# Patient Record
Sex: Male | Born: 1935 | Race: White | Hispanic: No | Marital: Married | Smoking: Former smoker
Health system: Southern US, Community
[De-identification: ages and names within clinical notes are randomized; demographics above are authoritative.]

## PROBLEM LIST (undated history)

## (undated) DIAGNOSIS — E785 Hyperlipidemia, unspecified: Secondary | ICD-10-CM

## (undated) DIAGNOSIS — I714 Abdominal aortic aneurysm, without rupture, unspecified: Secondary | ICD-10-CM

## (undated) DIAGNOSIS — I5189 Other ill-defined heart diseases: Secondary | ICD-10-CM

## (undated) DIAGNOSIS — I10 Essential (primary) hypertension: Secondary | ICD-10-CM

## (undated) DIAGNOSIS — E213 Hyperparathyroidism, unspecified: Secondary | ICD-10-CM

## (undated) DIAGNOSIS — I129 Hypertensive chronic kidney disease with stage 1 through stage 4 chronic kidney disease, or unspecified chronic kidney disease: Secondary | ICD-10-CM

## (undated) DIAGNOSIS — E079 Disorder of thyroid, unspecified: Secondary | ICD-10-CM

## (undated) DIAGNOSIS — N184 Chronic kidney disease, stage 4 (severe): Secondary | ICD-10-CM

## (undated) HISTORY — DX: Hypertensive chronic kidney disease with stage 1 through stage 4 chronic kidney disease, or unspecified chronic kidney disease: I12.9

## (undated) HISTORY — DX: Other ill-defined heart diseases: I51.89

## (undated) HISTORY — DX: Abdominal aortic aneurysm, without rupture, unspecified: I71.40

## (undated) HISTORY — DX: Disorder of thyroid, unspecified: E07.9

## (undated) HISTORY — DX: Hyperparathyroidism, unspecified: E21.3

## (undated) HISTORY — DX: Abdominal aortic aneurysm, without rupture: I71.4

## (undated) HISTORY — DX: Chronic kidney disease, stage 4 (severe): N18.4

## (undated) HISTORY — DX: Hyperlipidemia, unspecified: E78.5

## (undated) HISTORY — DX: Essential (primary) hypertension: I10

---

## 2001-03-09 HISTORY — PX: CYSTECTOMY: SUR359

## 2001-05-04 ENCOUNTER — Encounter: Payer: Self-pay | Admitting: Emergency Medicine

## 2001-05-04 ENCOUNTER — Inpatient Hospital Stay (HOSPITAL_COMMUNITY): Admission: EM | Admit: 2001-05-04 | Discharge: 2001-05-12 | Payer: Self-pay | Admitting: Emergency Medicine

## 2001-05-05 ENCOUNTER — Encounter: Payer: Self-pay | Admitting: Family Medicine

## 2001-05-10 ENCOUNTER — Encounter: Payer: Self-pay | Admitting: Family Medicine

## 2001-05-17 ENCOUNTER — Encounter: Admission: RE | Admit: 2001-05-17 | Discharge: 2001-05-17 | Payer: Self-pay | Admitting: Family Medicine

## 2001-06-20 ENCOUNTER — Ambulatory Visit (HOSPITAL_COMMUNITY): Admission: RE | Admit: 2001-06-20 | Discharge: 2001-06-20 | Payer: Self-pay | Admitting: Gastroenterology

## 2001-06-20 ENCOUNTER — Encounter: Payer: Self-pay | Admitting: Gastroenterology

## 2001-06-23 ENCOUNTER — Ambulatory Visit (HOSPITAL_COMMUNITY): Admission: RE | Admit: 2001-06-23 | Discharge: 2001-06-23 | Payer: Self-pay | Admitting: Gastroenterology

## 2001-06-23 ENCOUNTER — Encounter: Payer: Self-pay | Admitting: Gastroenterology

## 2001-07-27 ENCOUNTER — Ambulatory Visit (HOSPITAL_COMMUNITY): Admission: RE | Admit: 2001-07-27 | Discharge: 2001-07-27 | Payer: Self-pay | Admitting: Gastroenterology

## 2001-07-27 ENCOUNTER — Encounter: Payer: Self-pay | Admitting: Gastroenterology

## 2001-08-30 ENCOUNTER — Encounter: Payer: Self-pay | Admitting: General Surgery

## 2001-08-30 ENCOUNTER — Ambulatory Visit (HOSPITAL_COMMUNITY): Admission: RE | Admit: 2001-08-30 | Discharge: 2001-08-30 | Payer: Self-pay | Admitting: General Surgery

## 2001-09-02 ENCOUNTER — Emergency Department (HOSPITAL_COMMUNITY): Admission: EM | Admit: 2001-09-02 | Discharge: 2001-09-02 | Payer: Self-pay | Admitting: *Deleted

## 2001-09-02 ENCOUNTER — Encounter: Payer: Self-pay | Admitting: *Deleted

## 2001-09-08 ENCOUNTER — Encounter: Payer: Self-pay | Admitting: General Surgery

## 2001-09-12 ENCOUNTER — Inpatient Hospital Stay (HOSPITAL_COMMUNITY): Admission: RE | Admit: 2001-09-12 | Discharge: 2001-09-18 | Payer: Self-pay | Admitting: General Surgery

## 2001-09-12 ENCOUNTER — Encounter (INDEPENDENT_AMBULATORY_CARE_PROVIDER_SITE_OTHER): Payer: Self-pay | Admitting: Specialist

## 2001-09-12 ENCOUNTER — Encounter: Payer: Self-pay | Admitting: General Surgery

## 2001-09-22 ENCOUNTER — Encounter: Payer: Self-pay | Admitting: Emergency Medicine

## 2001-09-22 ENCOUNTER — Inpatient Hospital Stay (HOSPITAL_COMMUNITY): Admission: EM | Admit: 2001-09-22 | Discharge: 2001-10-03 | Payer: Self-pay | Admitting: Emergency Medicine

## 2001-09-23 ENCOUNTER — Encounter: Payer: Self-pay | Admitting: General Surgery

## 2002-01-18 ENCOUNTER — Encounter: Payer: Self-pay | Admitting: General Surgery

## 2002-01-18 ENCOUNTER — Ambulatory Visit (HOSPITAL_COMMUNITY): Admission: RE | Admit: 2002-01-18 | Discharge: 2002-01-18 | Payer: Self-pay | Admitting: General Surgery

## 2004-07-22 ENCOUNTER — Encounter (INDEPENDENT_AMBULATORY_CARE_PROVIDER_SITE_OTHER): Payer: Self-pay | Admitting: Cardiology

## 2004-07-22 ENCOUNTER — Ambulatory Visit (HOSPITAL_COMMUNITY): Admission: RE | Admit: 2004-07-22 | Discharge: 2004-07-22 | Payer: Self-pay | Admitting: Family Medicine

## 2005-07-17 ENCOUNTER — Inpatient Hospital Stay (HOSPITAL_BASED_OUTPATIENT_CLINIC_OR_DEPARTMENT_OTHER): Admission: RE | Admit: 2005-07-17 | Discharge: 2005-07-17 | Payer: Self-pay | Admitting: Cardiovascular Disease

## 2006-07-20 ENCOUNTER — Encounter: Admission: RE | Admit: 2006-07-20 | Discharge: 2006-07-20 | Payer: Self-pay | Admitting: Gastroenterology

## 2006-11-15 ENCOUNTER — Ambulatory Visit: Payer: Self-pay | Admitting: Surgery

## 2007-01-17 ENCOUNTER — Ambulatory Visit: Payer: Self-pay | Admitting: Surgery

## 2007-01-17 ENCOUNTER — Encounter: Admission: RE | Admit: 2007-01-17 | Discharge: 2007-01-17 | Payer: Self-pay | Admitting: Surgery

## 2008-01-23 ENCOUNTER — Ambulatory Visit: Payer: Self-pay | Admitting: Surgery

## 2008-01-23 ENCOUNTER — Encounter: Admission: RE | Admit: 2008-01-23 | Discharge: 2008-01-23 | Payer: Self-pay | Admitting: Surgery

## 2008-10-18 ENCOUNTER — Other Ambulatory Visit: Payer: Self-pay | Admitting: Emergency Medicine

## 2008-10-18 ENCOUNTER — Inpatient Hospital Stay (HOSPITAL_COMMUNITY): Admission: EM | Admit: 2008-10-18 | Discharge: 2008-10-24 | Payer: Self-pay | Admitting: Nephrology

## 2008-10-19 ENCOUNTER — Ambulatory Visit: Payer: Self-pay | Admitting: Surgery

## 2008-10-28 ENCOUNTER — Inpatient Hospital Stay (HOSPITAL_COMMUNITY): Admission: EM | Admit: 2008-10-28 | Discharge: 2008-10-31 | Payer: Self-pay | Admitting: Emergency Medicine

## 2008-12-11 ENCOUNTER — Emergency Department (HOSPITAL_COMMUNITY): Admission: EM | Admit: 2008-12-11 | Discharge: 2008-12-11 | Payer: Self-pay | Admitting: Emergency Medicine

## 2008-12-18 ENCOUNTER — Encounter (HOSPITAL_COMMUNITY): Admission: RE | Admit: 2008-12-18 | Discharge: 2009-03-18 | Payer: Self-pay | Admitting: Nephrology

## 2009-01-28 ENCOUNTER — Ambulatory Visit: Payer: Self-pay | Admitting: Surgery

## 2009-04-02 ENCOUNTER — Encounter (HOSPITAL_COMMUNITY): Admission: RE | Admit: 2009-04-02 | Discharge: 2009-07-01 | Payer: Self-pay | Admitting: Nephrology

## 2009-07-08 ENCOUNTER — Ambulatory Visit: Payer: Self-pay | Admitting: Surgery

## 2009-07-09 ENCOUNTER — Encounter (HOSPITAL_COMMUNITY): Admission: RE | Admit: 2009-07-09 | Discharge: 2009-10-07 | Payer: Self-pay | Admitting: Nephrology

## 2009-10-15 ENCOUNTER — Encounter (HOSPITAL_COMMUNITY): Admission: RE | Admit: 2009-10-15 | Discharge: 2010-01-13 | Payer: Self-pay | Admitting: Nephrology

## 2009-11-21 IMAGING — CR DG CHEST 2V
2 series · 2 of 2 positions shown · non-contrast
Comparison: 09/23/2001

CLINICAL DATA: Weakness.

CHEST - 2 VIEW

[w chest pa]
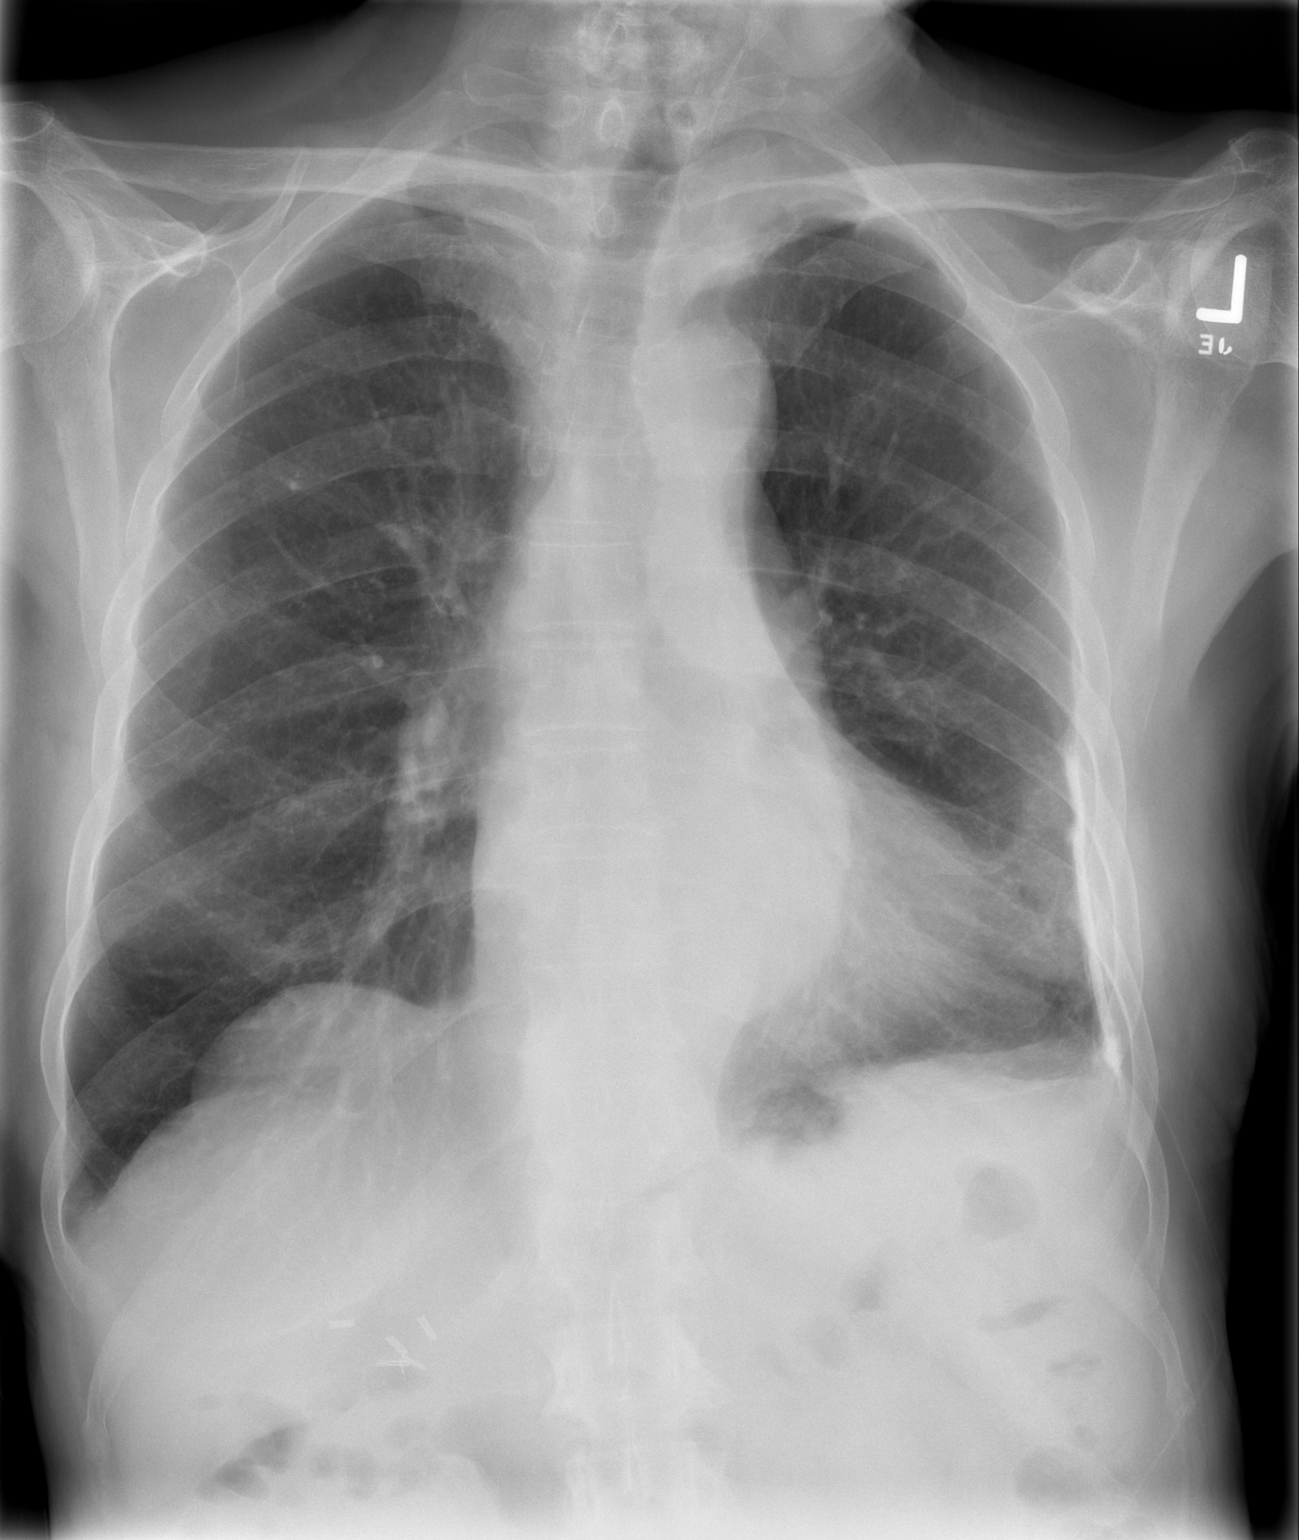

[w chest lat]
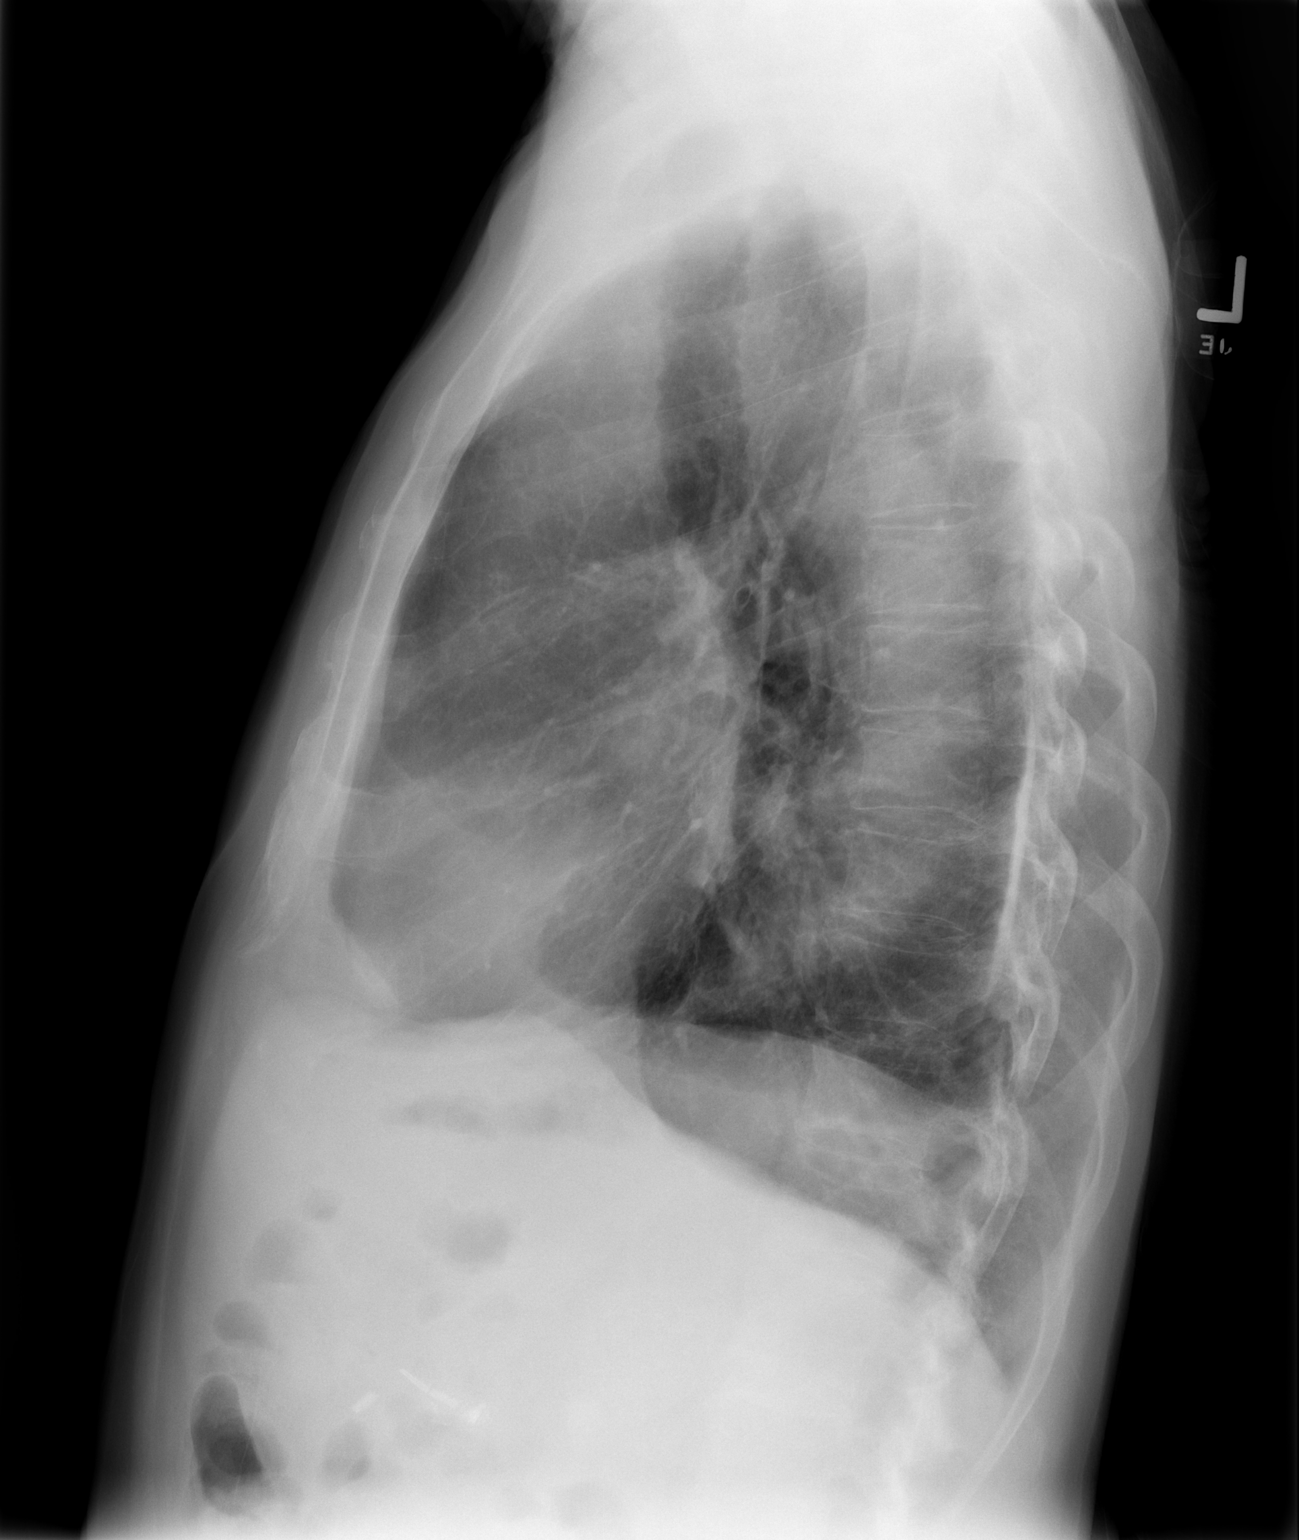

[2 of 2 positions shown; findings below may reference images not displayed]

FINDINGS: Heart size is mildly enlarged.

There is chronic pleural and parenchymal scarring at the left lung
base.  Calcified plaque is noted at the left base.

No airspace consolidation identified.

Review of the visualized osseous structures is unremarkable.
IMPRESSION: 1.  No acute findings.
2.  Chronic changes as above.

## 2010-01-21 ENCOUNTER — Encounter (HOSPITAL_COMMUNITY)
Admission: RE | Admit: 2010-01-21 | Discharge: 2010-04-08 | Payer: Self-pay | Source: Home / Self Care | Attending: Nephrology | Admitting: Nephrology

## 2010-03-24 ENCOUNTER — Ambulatory Visit
Admission: RE | Admit: 2010-03-24 | Discharge: 2010-03-24 | Payer: Self-pay | Source: Home / Self Care | Attending: Surgery | Admitting: Surgery

## 2010-04-07 NOTE — Procedures (Unsigned)
DUPLEX ULTRASOUND OF ABDOMINAL AORTA  INDICATION:  Followup AAA.  HISTORY: Diabetes:  No. Cardiac:  No. Hypertension:  Yes. Smoking:  No. Connective Tissue Disorder: Family History:  No. Previous Surgery:  No.  DUPLEX EXAM:         AP (cm)                   TRANSVERSE (cm) Proximal             Not visualized            Not visualized Mid                  4.06 cm                   3.80 cm Distal               4.54 cm                   4.73 cm Right Iliac          1.30 cm                   1.51 cm Left Iliac           1.35 cm                   1.35 cm  PREVIOUS:  Date:  07/08/2009  AP:  4.45  TRANSVERSE:  4.74  IMPRESSION:  Stable abdominal aortic dilatation that measures 4.54 x 4.73 cm.  ___________________________________________ V. Charlena Cross, MD  EM/MEDQ  D:  03/24/2010  T:  03/24/2010  Job:  [id number]

## 2010-05-20 LAB — FERRITIN: Ferritin: 264 ng/mL (ref 22–322)

## 2010-05-20 LAB — POCT HEMOGLOBIN-HEMACUE
Hemoglobin: 13.2 g/dL (ref 13.0–17.0)
Hemoglobin: 13.7 g/dL (ref 13.0–17.0)

## 2010-05-22 LAB — POCT HEMOGLOBIN-HEMACUE
Hemoglobin: 12.4 g/dL — ABNORMAL LOW (ref 13.0–17.0)
Hemoglobin: 14.3 g/dL (ref 13.0–17.0)
Hemoglobin: 13.5 g/dL (ref 13.0–17.0)

## 2010-05-22 LAB — FERRITIN
Ferritin: 230 ng/mL (ref 22–322)
Ferritin: 256 ng/mL (ref 22–322)

## 2010-05-22 LAB — IRON AND TIBC: Iron: 126 ug/dL (ref 42–135)

## 2010-05-23 LAB — POCT HEMOGLOBIN-HEMACUE: Hemoglobin: 13.5 g/dL (ref 13.0–17.0)

## 2010-05-23 LAB — IRON AND TIBC: Iron: 114 ug/dL (ref 42–135)

## 2010-05-24 LAB — IRON AND TIBC
Saturation Ratios: 31 % (ref 20–55)
TIBC: 307 ug/dL (ref 215–435)
UIBC: 213 ug/dL

## 2010-05-24 LAB — FERRITIN: Ferritin: 252 ng/mL (ref 22–322)

## 2010-05-24 LAB — POCT HEMOGLOBIN-HEMACUE: Hemoglobin: 12.5 g/dL — ABNORMAL LOW (ref 13.0–17.0)

## 2010-05-25 LAB — FERRITIN: Ferritin: 207 ng/mL (ref 22–322)

## 2010-05-25 LAB — POCT HEMOGLOBIN-HEMACUE
Hemoglobin: 15.1 g/dL (ref 13.0–17.0)
Hemoglobin: 13.5 g/dL (ref 13.0–17.0)
Hemoglobin: 13.5 g/dL (ref 13.0–17.0)
Hemoglobin: 13.1 g/dL (ref 13.0–17.0)

## 2010-05-25 LAB — IRON AND TIBC
Iron: 114 ug/dL (ref 42–135)
TIBC: 308 ug/dL (ref 215–435)

## 2010-05-26 LAB — IRON AND TIBC
Iron: 80 ug/dL (ref 42–135)
TIBC: 294 ug/dL (ref 215–435)
UIBC: 214 ug/dL

## 2010-05-26 LAB — POCT HEMOGLOBIN-HEMACUE: Hemoglobin: 12.5 g/dL — ABNORMAL LOW (ref 13.0–17.0)

## 2010-05-27 LAB — FERRITIN: Ferritin: 208 ng/mL (ref 22–322)

## 2010-05-27 LAB — IRON AND TIBC: TIBC: 310 ug/dL (ref 215–435)

## 2010-05-27 LAB — POCT HEMOGLOBIN-HEMACUE: Hemoglobin: 12.1 g/dL — ABNORMAL LOW (ref 13.0–17.0)

## 2010-05-28 LAB — IRON AND TIBC
Iron: 103 ug/dL (ref 42–135)
TIBC: 269 ug/dL (ref 215–435)
TIBC: 307 ug/dL (ref 215–435)
UIBC: 204 ug/dL

## 2010-05-28 LAB — POCT HEMOGLOBIN-HEMACUE: Hemoglobin: 11.8 g/dL — ABNORMAL LOW (ref 13.0–17.0)

## 2010-05-28 LAB — FERRITIN
Ferritin: 337 ng/mL — ABNORMAL HIGH (ref 22–322)
Ferritin: 266 ng/mL (ref 22–322)

## 2010-06-01 LAB — IRON AND TIBC: UIBC: 178 ug/dL

## 2010-06-01 LAB — FERRITIN: Ferritin: 312 ng/mL (ref 22–322)

## 2010-06-01 LAB — POCT HEMOGLOBIN-HEMACUE: Hemoglobin: 12.8 g/dL — ABNORMAL LOW (ref 13.0–17.0)

## 2010-06-09 LAB — POCT HEMOGLOBIN-HEMACUE: Hemoglobin: 12.6 g/dL — ABNORMAL LOW (ref 13.0–17.0)

## 2010-06-10 LAB — IRON AND TIBC
Iron: 62 ug/dL (ref 42–135)
UIBC: 248 ug/dL

## 2010-06-10 LAB — FERRITIN: Ferritin: 171 ng/mL (ref 22–322)

## 2010-06-11 LAB — IRON AND TIBC
Saturation Ratios: 16 % — ABNORMAL LOW (ref 20–55)
TIBC: 271 ug/dL (ref 215–435)

## 2010-06-11 LAB — POCT HEMOGLOBIN-HEMACUE
Hemoglobin: 11.6 g/dL — ABNORMAL LOW (ref 13.0–17.0)
Hemoglobin: 12.3 g/dL — ABNORMAL LOW (ref 13.0–17.0)
Hemoglobin: 10.6 g/dL — ABNORMAL LOW (ref 13.0–17.0)
Hemoglobin: 10.8 g/dL — ABNORMAL LOW (ref 13.0–17.0)

## 2010-06-12 LAB — COMPREHENSIVE METABOLIC PANEL
ALT: 9 U/L (ref 0–53)
AST: 18 U/L (ref 0–37)
Alkaline Phosphatase: 93 U/L (ref 39–117)
CO2: 26 mEq/L (ref 19–32)
Chloride: 109 mEq/L (ref 96–112)
GFR calc Af Amer: 18 mL/min — ABNORMAL LOW (ref >60)
GFR calc non Af Amer: 15 mL/min — ABNORMAL LOW (ref >60)
Glucose, Bld: 162 mg/dL — ABNORMAL HIGH (ref 70–99)
Sodium: 141 mEq/L (ref 135–145)
Total Bilirubin: 0.8 mg/dL (ref 0.3–1.2)

## 2010-06-12 LAB — DIFFERENTIAL
Basophils Absolute: 0 10*3/uL (ref 0.0–0.1)
Basophils Relative: 1 % (ref 0–1)
Eosinophils Absolute: 0.1 10*3/uL (ref 0.0–0.7)
Eosinophils Relative: 2 % (ref 0–5)
Lymphs Abs: 0.8 10*3/uL (ref 0.7–4.0)
Neutrophils Relative %: 77 % (ref 43–77)

## 2010-06-12 LAB — PROTIME-INR
INR: 1.16 (ref 0.00–1.49)
Prothrombin Time: 14.7 seconds (ref 11.6–15.2)

## 2010-06-12 LAB — CBC
Hemoglobin: 8 g/dL — ABNORMAL LOW (ref 13.0–17.0)
MCHC: 34 g/dL (ref 30.0–36.0)
RBC: 2.41 MIL/uL — ABNORMAL LOW (ref 4.22–5.81)
WBC: 6.6 10*3/uL (ref 4.0–10.5)

## 2010-06-12 LAB — TYPE AND SCREEN: Antibody Screen: NEGATIVE

## 2010-06-12 LAB — POCT HEMOGLOBIN-HEMACUE: Hemoglobin: 8.7 g/dL — ABNORMAL LOW (ref 13.0–17.0)

## 2010-06-14 LAB — URINALYSIS, MICROSCOPIC ONLY
Bilirubin Urine: NEGATIVE
Leukocytes, UA: NEGATIVE
Nitrite: NEGATIVE
Specific Gravity, Urine: 1.009 (ref 1.005–1.030)
Urobilinogen, UA: 0.2 mg/dL (ref 0.0–1.0)
pH: 7.5 (ref 5.0–8.0)

## 2010-06-14 LAB — URINALYSIS, ROUTINE W REFLEX MICROSCOPIC
Bilirubin Urine: NEGATIVE
Glucose, UA: 250 mg/dL — AB
Ketones, ur: 15 mg/dL — AB
Nitrite: NEGATIVE
Protein, ur: 100 mg/dL — AB
Specific Gravity, Urine: 1.013 (ref 1.005–1.030)
Urobilinogen, UA: 0.2 mg/dL (ref 0.0–1.0)
pH: 6.5 (ref 5.0–8.0)

## 2010-06-14 LAB — PROTEIN ELECTROPHORESIS, SERUM
Alpha-2-Globulin: 12.3 % — ABNORMAL HIGH (ref 7.1–11.8)
Beta Globulin: 5.8 % (ref 4.7–7.2)
Gamma Globulin: 15.2 % (ref 11.1–18.8)
M-Spike, %: NOT DETECTED g/dL

## 2010-06-14 LAB — RENAL FUNCTION PANEL
Albumin: 2.5 g/dL — ABNORMAL LOW (ref 3.5–5.2)
Albumin: 2.7 g/dL — ABNORMAL LOW (ref 3.5–5.2)
Albumin: 3 g/dL — ABNORMAL LOW (ref 3.5–5.2)
Albumin: 2.9 g/dL — ABNORMAL LOW (ref 3.5–5.2)
Albumin: 2.9 g/dL — ABNORMAL LOW (ref 3.5–5.2)
Albumin: 3.2 g/dL — ABNORMAL LOW (ref 3.5–5.2)
BUN: 60 mg/dL — ABNORMAL HIGH (ref 6–23)
BUN: 60 mg/dL — ABNORMAL HIGH (ref 6–23)
CO2: 18 mEq/L — ABNORMAL LOW (ref 19–32)
CO2: 23 mEq/L (ref 19–32)
CO2: 28 mEq/L (ref 19–32)
Calcium: 8.3 mg/dL — ABNORMAL LOW (ref 8.4–10.5)
Calcium: 8.3 mg/dL — ABNORMAL LOW (ref 8.4–10.5)
Calcium: 8.4 mg/dL (ref 8.4–10.5)
Calcium: 8.7 mg/dL (ref 8.4–10.5)
Calcium: 9 mg/dL (ref 8.4–10.5)
Calcium: 8.6 mg/dL (ref 8.4–10.5)
Chloride: 111 mEq/L (ref 96–112)
Chloride: 113 mEq/L — ABNORMAL HIGH (ref 96–112)
Chloride: 112 mEq/L (ref 96–112)
Chloride: 96 mEq/L (ref 96–112)
Creatinine, Ser: 10.98 mg/dL — ABNORMAL HIGH (ref 0.4–1.5)
Creatinine, Ser: 6.35 mg/dL — ABNORMAL HIGH (ref 0.4–1.5)
Creatinine, Ser: 6.45 mg/dL — ABNORMAL HIGH (ref 0.4–1.5)
Creatinine, Ser: 6.72 mg/dL — ABNORMAL HIGH (ref 0.4–1.5)
Creatinine, Ser: 8.18 mg/dL — ABNORMAL HIGH (ref 0.4–1.5)
Creatinine, Ser: 11.98 mg/dL — ABNORMAL HIGH (ref 0.4–1.5)
Creatinine, Ser: 13.5 mg/dL — ABNORMAL HIGH (ref 0.4–1.5)
Creatinine, Ser: 14.32 mg/dL — ABNORMAL HIGH (ref 0.4–1.5)
GFR calc Af Amer: 10 mL/min — ABNORMAL LOW (ref >60)
GFR calc Af Amer: 10 mL/min — ABNORMAL LOW (ref >60)
GFR calc Af Amer: 10 mL/min — ABNORMAL LOW (ref >60)
GFR calc Af Amer: 6 mL/min — ABNORMAL LOW (ref >60)
GFR calc Af Amer: 7 mL/min — ABNORMAL LOW (ref >60)
GFR calc Af Amer: 8 mL/min — ABNORMAL LOW (ref >60)
GFR calc Af Amer: 4 mL/min — ABNORMAL LOW (ref >60)
GFR calc Af Amer: 4 mL/min — ABNORMAL LOW (ref >60)
GFR calc Af Amer: 5 mL/min — ABNORMAL LOW (ref >60)
GFR calc non Af Amer: 5 mL/min — ABNORMAL LOW (ref >60)
GFR calc non Af Amer: 6 mL/min — ABNORMAL LOW (ref >60)
GFR calc non Af Amer: 6 mL/min — ABNORMAL LOW (ref >60)
GFR calc non Af Amer: 8 mL/min — ABNORMAL LOW (ref >60)
GFR calc non Af Amer: 9 mL/min — ABNORMAL LOW (ref >60)
GFR calc non Af Amer: 3 mL/min — ABNORMAL LOW (ref >60)
GFR calc non Af Amer: 4 mL/min — ABNORMAL LOW (ref >60)
GFR calc non Af Amer: 4 mL/min — ABNORMAL LOW (ref >60)
Glucose, Bld: 84 mg/dL (ref 70–99)
Glucose, Bld: 91 mg/dL (ref 70–99)
Glucose, Bld: 97 mg/dL (ref 70–99)
Phosphorus: 5 mg/dL — ABNORMAL HIGH (ref 2.3–4.6)
Phosphorus: 5.6 mg/dL — ABNORMAL HIGH (ref 2.3–4.6)
Phosphorus: 6.3 mg/dL — ABNORMAL HIGH (ref 2.3–4.6)
Phosphorus: 7.2 mg/dL — ABNORMAL HIGH (ref 2.3–4.6)
Phosphorus: 10.6 mg/dL — ABNORMAL HIGH (ref 2.3–4.6)
Potassium: 3.4 mEq/L — ABNORMAL LOW (ref 3.5–5.1)
Potassium: 4.5 mEq/L (ref 3.5–5.1)
Sodium: 139 mEq/L (ref 135–145)
Sodium: 141 mEq/L (ref 135–145)
Sodium: 142 mEq/L (ref 135–145)
Sodium: 142 mEq/L (ref 135–145)
Sodium: 143 mEq/L (ref 135–145)

## 2010-06-14 LAB — DIFFERENTIAL
Basophils Absolute: 0.1 10*3/uL (ref 0.0–0.1)
Basophils Relative: 1 % (ref 0–1)
Eosinophils Absolute: 0.5 10*3/uL (ref 0.0–0.7)
Eosinophils Relative: 7 % — ABNORMAL HIGH (ref 0–5)
Lymphocytes Relative: 15 % (ref 12–46)
Lymphs Abs: 1.1 10*3/uL (ref 0.7–4.0)
Monocytes Absolute: 0.7 K/uL (ref 0.1–1.0)
Monocytes Relative: 9 % (ref 3–12)
Neutro Abs: 5.3 K/uL (ref 1.7–7.7)
Neutrophils Relative %: 69 % (ref 43–77)

## 2010-06-14 LAB — TYPE AND SCREEN
ABO/RH(D): O POS
Antibody Screen: NEGATIVE

## 2010-06-14 LAB — PTH, INTACT AND CALCIUM: Calcium, Total (PTH): 8.5 mg/dL (ref 8.4–10.5)

## 2010-06-14 LAB — CBC
HCT: 25.4 % — ABNORMAL LOW (ref 39.0–52.0)
HCT: 25.7 % — ABNORMAL LOW (ref 39.0–52.0)
HCT: 26.2 % — ABNORMAL LOW (ref 39.0–52.0)
Hemoglobin: 8.7 g/dL — ABNORMAL LOW (ref 13.0–17.0)
Hemoglobin: 9.1 g/dL — ABNORMAL LOW (ref 13.0–17.0)
MCHC: 34.4 g/dL (ref 30.0–36.0)
MCHC: 34.4 g/dL (ref 30.0–36.0)
MCHC: 34.6 g/dL (ref 30.0–36.0)
MCHC: 34.7 g/dL (ref 30.0–36.0)
MCHC: 34.7 g/dL (ref 30.0–36.0)
MCHC: 34.9 g/dL (ref 30.0–36.0)
MCV: 92.3 fL (ref 78.0–100.0)
MCV: 92.4 fL (ref 78.0–100.0)
MCV: 92.4 fL (ref 78.0–100.0)
MCV: 95.4 fL (ref 78.0–100.0)
MCV: 95.4 fL (ref 78.0–100.0)
Platelets: 109 10*3/uL — ABNORMAL LOW (ref 150–400)
Platelets: 170 10*3/uL (ref 150–400)
Platelets: 90 10*3/uL — ABNORMAL LOW (ref 150–400)
Platelets: 113 10*3/uL — ABNORMAL LOW (ref 150–400)
Platelets: 115 10*3/uL — ABNORMAL LOW (ref 150–400)
Platelets: 117 10*3/uL — ABNORMAL LOW (ref 150–400)
RBC: 2.66 MIL/uL — ABNORMAL LOW (ref 4.22–5.81)
RBC: 2.83 MIL/uL — ABNORMAL LOW (ref 4.22–5.81)
RBC: 3.15 MIL/uL — ABNORMAL LOW (ref 4.22–5.81)
RDW: 13.7 % (ref 11.5–15.5)
RDW: 13.3 % (ref 11.5–15.5)
RDW: 13.5 % (ref 11.5–15.5)
WBC: 4 10*3/uL (ref 4.0–10.5)
WBC: 4.5 10*3/uL (ref 4.0–10.5)
WBC: 4.8 10*3/uL (ref 4.0–10.5)
WBC: 7.7 10*3/uL (ref 4.0–10.5)
WBC: 4.3 10*3/uL (ref 4.0–10.5)

## 2010-06-14 LAB — COMPREHENSIVE METABOLIC PANEL
AST: 11 U/L (ref 0–37)
Albumin: 2.9 g/dL — ABNORMAL LOW (ref 3.5–5.2)
Calcium: 8.3 mg/dL — ABNORMAL LOW (ref 8.4–10.5)
Creatinine, Ser: 14.74 mg/dL — ABNORMAL HIGH (ref 0.4–1.5)
GFR calc Af Amer: 4 mL/min — ABNORMAL LOW (ref >60)
GFR calc non Af Amer: 3 mL/min — ABNORMAL LOW (ref >60)

## 2010-06-14 LAB — BASIC METABOLIC PANEL
BUN: 55 mg/dL — ABNORMAL HIGH (ref 6–23)
CO2: 18 mEq/L — ABNORMAL LOW (ref 19–32)
CO2: 23 mEq/L (ref 19–32)
Calcium: 8.6 mg/dL (ref 8.4–10.5)
Calcium: 8.7 mg/dL (ref 8.4–10.5)
Chloride: 107 mEq/L (ref 96–112)
Creatinine, Ser: 10.13 mg/dL — ABNORMAL HIGH (ref 0.4–1.5)
Creatinine, Ser: 6.75 mg/dL — ABNORMAL HIGH (ref 0.4–1.5)
Creatinine, Ser: 9.03 mg/dL — ABNORMAL HIGH (ref 0.4–1.5)
GFR calc Af Amer: 10 mL/min — ABNORMAL LOW (ref >60)
GFR calc Af Amer: 6 mL/min — ABNORMAL LOW (ref >60)
GFR calc Af Amer: 7 mL/min — ABNORMAL LOW (ref >60)
GFR calc non Af Amer: 6 mL/min — ABNORMAL LOW (ref >60)
GFR calc non Af Amer: 8 mL/min — ABNORMAL LOW (ref >60)
Potassium: 3.6 mEq/L (ref 3.5–5.1)
Sodium: 140 mEq/L (ref 135–145)

## 2010-06-14 LAB — ABO/RH: ABO/RH(D): O POS

## 2010-06-14 LAB — PROTIME-INR
INR: 1.3 (ref 0.00–1.49)
INR: 1.2 (ref 0.00–1.49)
Prothrombin Time: 15.6 seconds — ABNORMAL HIGH (ref 11.6–15.2)

## 2010-06-14 LAB — HEMOGLOBIN AND HEMATOCRIT, BLOOD
HCT: 26 % — ABNORMAL LOW (ref 39.0–52.0)
Hemoglobin: 7.4 g/dL — CL (ref 13.0–17.0)
Hemoglobin: 9 g/dL — ABNORMAL LOW (ref 13.0–17.0)
Hemoglobin: 9.3 g/dL — ABNORMAL LOW (ref 13.0–17.0)

## 2010-06-14 LAB — POCT I-STAT, CHEM 8
BUN: 62 mg/dL — ABNORMAL HIGH (ref 6–23)
Creatinine, Ser: 6.6 mg/dL — ABNORMAL HIGH (ref 0.4–1.5)
Hemoglobin: 8.8 g/dL — ABNORMAL LOW (ref 13.0–17.0)
Potassium: 3.8 mEq/L (ref 3.5–5.1)
Sodium: 139 mEq/L (ref 135–145)
TCO2: 19 mmol/L (ref 0–100)

## 2010-06-14 LAB — UIFE/LIGHT CHAINS/TP QN, 24-HR UR
Albumin, U: DETECTED
Alpha 1, Urine: DETECTED — AB
Beta, Urine: DETECTED — AB
Free Lambda Excretion/Day: 108.97 mg/d
Gamma Globulin, Urine: DETECTED — AB
Total Protein, Urine: 25 mg/dL

## 2010-06-14 LAB — CK: Total CK: 38 U/L (ref 7–232)

## 2010-06-14 LAB — CLOSTRIDIUM DIFFICILE EIA

## 2010-06-14 LAB — URINE MICROSCOPIC-ADD ON

## 2010-06-14 LAB — HEMOCCULT GUIAC POC 1CARD (OFFICE): Fecal Occult Bld: NEGATIVE

## 2010-06-14 LAB — IRON AND TIBC

## 2010-06-14 LAB — APTT: aPTT: 30 s (ref 24–37)

## 2010-06-14 LAB — C3 COMPLEMENT: C3 Complement: 102 mg/dL (ref 88–201)

## 2010-06-15 LAB — COMPREHENSIVE METABOLIC PANEL
ALT: 11 U/L (ref 0–53)
Alkaline Phosphatase: 62 U/L (ref 39–117)
BUN: 107 mg/dL — ABNORMAL HIGH (ref 6–23)
CO2: 13 mEq/L — ABNORMAL LOW (ref 19–32)
Calcium: 8.7 mg/dL (ref 8.4–10.5)
GFR calc non Af Amer: 3 mL/min — ABNORMAL LOW (ref >60)
Glucose, Bld: 99 mg/dL (ref 70–99)
Potassium: 4.2 mEq/L (ref 3.5–5.1)
Sodium: 137 mEq/L (ref 135–145)

## 2010-06-15 LAB — URINALYSIS, ROUTINE W REFLEX MICROSCOPIC
Glucose, UA: 250 mg/dL — AB
Protein, ur: 100 mg/dL — AB
Specific Gravity, Urine: 1.012 (ref 1.005–1.030)
pH: 6 (ref 5.0–8.0)

## 2010-06-15 LAB — URINE CULTURE: Culture: NO GROWTH

## 2010-06-15 LAB — DIFFERENTIAL
Basophils Absolute: 0 10*3/uL (ref 0.0–0.1)
Basophils Relative: 0 % (ref 0–1)
Eosinophils Absolute: 0.4 10*3/uL (ref 0.0–0.7)
Neutro Abs: 2.9 10*3/uL (ref 1.7–7.7)
Neutrophils Relative %: 60 % (ref 43–77)

## 2010-06-15 LAB — URINE MICROSCOPIC-ADD ON

## 2010-06-15 LAB — CBC
HCT: 33.5 % — ABNORMAL LOW (ref 39.0–52.0)
Hemoglobin: 11.5 g/dL — ABNORMAL LOW (ref 13.0–17.0)
MCHC: 34.3 g/dL (ref 30.0–36.0)
RBC: 3.5 MIL/uL — ABNORMAL LOW (ref 4.22–5.81)
RDW: 13.7 % (ref 11.5–15.5)

## 2010-06-15 LAB — HEMOCCULT GUIAC POC 1CARD (OFFICE): Fecal Occult Bld: NEGATIVE

## 2010-07-22 NOTE — Consult Note (Signed)
NAME:  [name] [name], [name] [name]                ACCOUNT NO.:  [id number]   MEDICAL RECORD NO.:  [id number]          PATIENT TYPE:  INP   LOCATION:  6708                         FACILITY:  [facility]   PHYSICIAN:  Bernette Redbird, M.D.   DATE OF BIRTH:  [birth year]   DATE OF CONSULTATION:  DATE OF DISCHARGE:                                 CONSULTATION   Dr. Marthann Schiller of the [facility] asked [place] to see this 77-  year-old Caucasian male because of hematemesis.   [name] [name] was admitted to the hospital earlier today because of several  episodes of coffee-ground emesis yesterday evening, admixed with a small  amount of red blood, following perhaps 1 week prodrome of dyspeptic  symptoms after being treated with antibiotic therapy for a UTI.  He does  not have a past history of ulcer disease, but there is a remote history  of alcohol and cigarette abuse with pancreatic pseudocyst necessitating  cystogastrostomy drainage about 7 years ago, complicated by erosion and  severe hemorrhage requiring reoperation a few days later.  He is also  known to have an abdominal aortic aneurysm, although discussion with the  radiologist indicates that there appeared to be pretty good fat planes  in the region of the duodenal aortic apposition, rendering a low  likelihood that his recent bleeding reflected an aortoenteric fistula.  In fact, the main part of the aneurysm is substantially inferior to the  duodenal region.   The patient has had progressive chronic renal insufficiency with a  current creatinine clearance estimated to be on the order of 7 mm per  minute, and a creatinine of around 6 or 7.  This is marked change from  June of this year when his creatinine was under 2.  He is not yet on  dialysis.  He denies significant NSAID exposure, taking perhaps just a  couple of Advils per month.  He is not on a daily aspirin.  He does  admit to a poor appetite in recent months, but it does not sound so  there  has been any significant weight loss.  No real change in bowel  habits.  No syncopal symptoms or presyncope at the time of his recent  bleed.   PAST MEDICAL HISTORY:  No known allergies.   OUTPATIENT MEDICATIONS:  Amlodipine, calcitriol, gemfibrozil, PhosLo,  sertraline.   OPERATIONS:  Cholecystectomy and pancreatic cystogastrostomy.   MEDICAL ILLNESSES:  History of previous ethanol abuse with pancreatic  pseudocyst and subsequent drainage, abdominal aortic aneurysm with  maximum diameter less than 5 cm on most recent imaging study,  hypertension, dyslipidemia.   HABITS:  Stopped smoking and drinking years ago.   FAMILY HISTORY:  Negative for GI illnesses.   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   REVIEW OF SYSTEMS:  Pertinent for anorexia but no significant weight  loss, recent heartburn symptoms only after being treated with  antibiotics, no chronic diarrhea, constipation, melenic stools, or  rectal bleeding.   PHYSICAL EXAMINATION:  GENERAL:  A pleasant, reasonably well-preserved  and healthy-appearing Caucasian male, in no acute distress.  VITAL SIGNS:  Blood pressure 107/58, pulse 59, afebrile.  HEENT:  Anicteric.  No frank pallor, now status post transfusion of 1  unit of packed cells.  CHEST:  Clear to auscultation.  HEART:  Normal.  No gallops or arrhythmias.  ABDOMEN:  Benign with normal bowel sounds.  No organomegaly.  No  guarding, mass, or tenderness.  Fecal occult blood was negative on  admission.  RECTAL:  Not performed.   LABORATORY DATA:  The patient presented to the hospital with a  hemoglobin of 8.8, MCV 95, white count 7700, platelets 170,000.  On  recheck this morning, hemoglobin had fallen to 7.4.  He has now received  the first of 2 units of packed cells to be transfused.  Chemistry panel  pertinent for normal INR of 1.3.  Renal panel  showing BUN 60, creatinine  6.75, bicarb 18.  TSH normal.  Urinalysis at this time showing just a  few white cells, some blood, and protein   IMPRESSION:  1. Hematemesis, etiology unclear.  2. Posthemorrhagic anemia secondary to hematemesis.  3. Past history of alcohol abuse, pancreatic pseudocyst, and bleeding      from erosion of pancreatic pseudocyst into pancreatic      cystogastrostomy 7 years ago.  4. Chronic renal insufficiency, now near end stage.   RECOMMENDATIONS:  I would favor endoscopic evaluation at this time.  The  patient is agreeable.  Further management will depend on the endoscopic  findings.  In addition, it is noted that the patient is up-to-date on  screening colonoscopy, have one by me as an outpatient within the past  several years.           ______________________________  Bernette Redbird, M.D.     RB/MEDQ  D:  10/28/2008  T:  10/29/2008  Job:  [id number]   cc:   Garnetta Buddy, M.D.

## 2010-07-22 NOTE — Discharge Summary (Signed)
NAME:  [name] [name], [name] [name]                ACCOUNT NO.:  [id number]   MEDICAL RECORD NO.:  [id number]          PATIENT TYPE:  INP   LOCATION:  6708                         FACILITY:  [facility]   PHYSICIAN:  Eduard Clos, MDDATE OF BIRTH:  Jul 15, 1929   DATE OF ADMISSION:  10/28/2008  DATE OF DISCHARGE:  10/31/2008                               DISCHARGE SUMMARY   COURSE IN THE HOSPITAL:  A 77 year old male with known history of  chronic kidney disease, hypertension, presented with vomiting blood.  The patient was admitted to medical floor and serial CBCs were done.  Gastroenterology consult with Dr. Matthias Hughs was obtained.  The patient's  hemoglobin on admission was 8.7 and which did decrease to 7 and the  patient did receive 2 units of packed red blood cells.  The patient  underwent endoscopy, which showed a gastric ulcer which was not bleeding  and as per Dr. Matthias Hughs, no further intervention at this time and we will  do a repeat PT in 2 month's time for which Dr. Matthias Hughs will be calling  the patient.   During this stay, the patient also had creatinine elevated which is  known to the patient.  Nephrology was following the patient.  At this  time, sodium bicarbonate has been added.  The patient's blood pressure  was slightly in the lower range.  The patient is to take amlodipine,  which has been discontinued.  At this time, the patient's hemoglobin has  been largely stable.  The patient is asymptomatic and his last  hemoglobin was 8.9 with hematocrit of 25.8.  The patient will be  discharged home with close followup with primary care physician within a  weeks time in Dr. Hyman Hopes, the patient has appointment tomorrow.  The  patient was advised to strongly keep the appointment with Dr.  Hyman Hopes  tomorrow.  At the time of this dictation, the patient is hemodynamically  stable.   PROCEDURES DONE DURING THIS STAY:  Endoscopy, esophagogastroduodenoscopy  on October 28, 2008, which showed gastric ulcer,  not bleeding.   CONSULTATIONS OBTAINED:  1. Gastroenterology, Dr. Matthias Hughs.  2. Nephrology, Dr. Hyman Hopes.   PERTINENT LABS:  At time of discharge, the patient's hemoglobin was 8.9,  hematocrit 25.8, creatinine was 6.3 with a bicarb of 18.   FINAL DIAGNOSES:  1. Hematemesis with gastrointestinal bleed secondary to gastric ulcer,      presently stable hemoglobin, hematocrit.  2. History of hypertension, off medications now.  3. Chronic kidney disease.  4. Hyperlipidemia.  5. History of depression with no suicidal ideations.   MEDICATIONS AT DISCHARGE:  1. Calcitriol 0.25 mcg p.o. daily.  2. Gemfibrozil 600 mg twice daily.  3. PhosLo 667 mg three times daily with meals.  4. Zoloft 50 mg p.o. b.i.d.  5. Protonix 40 mg p.o. daily.  6. Sodium bicarbonate 650 mg two tablets p.o. b.i.d.   PLAN:  1. The patient advised to follow up with his primary care physician      within a week's time, recheck CBC and BMET.  Strongly advised to      keep the appointment  with Dr. Hyman Hopes, nephrologist tomorrow, November 01, 2008.  2. Dr. Donavan Burnet office will be calling the patient for followup EGD      in 2 months' time.  I have already discussed with Dr. Matthias Hughs.  3. The patient is to be on a cardiac healthy renal diet.  4. Activity as tolerated.  5. If any recurrence of symptoms, the patient is to go to the nearest      ER.      Eduard Clos, MD  Electronically Signed     ANK/MEDQ  D:  10/31/2008  T:  10/31/2008  Job:  [id number]   cc:   Garnetta Buddy, M.D.  Bernette Redbird, M.D.  [facility]

## 2010-07-22 NOTE — Op Note (Signed)
NAME:  [name] [name], [name] [name]                ACCOUNT NO.:  [id number]   MEDICAL RECORD NO.:  [id number]          PATIENT TYPE:  INP   LOCATION:  6708                         FACILITY:  [facility]   PHYSICIAN:  Bernette Redbird, M.D.   DATE OF BIRTH:  11-24-1929   DATE OF PROCEDURE:  10/28/2008  DATE OF DISCHARGE:                               OPERATIVE REPORT   SURGEON:  Bernette Redbird, MD   PROCEDURE:  Upper endoscopy.   INDICATIONS:  A 77 year old gentleman with hematemesis yesterday,  characterized by about 5 episodes of coffee-ground emesis admixed with  some fresh red blood.   FINDINGS:  Superficial proximal gastric ulcer with stigma of recent  hemorrhage, but no active bleeding.   PROCEDURE:  The patient was familiar with the procedure from prior  examination and he provided written consent, being brought from his  hospital room to the Endoscopy Unit in a fasted state.  Sedation was  Versed 3 mg IV prior to and during the course of the procedure to a  level of mild-to-moderate sedation without clinical instability.  The  Pentax adult video endoscope was passed under direct vision.  The vocal  cords looked normal.  The esophagus was entered without difficulty and  was normal in its entirety except for a widely patent Schatzki ring at  the squamocolumnar junction, below which was a minimal hiatal hernia.  No esophageal varices, infection, neoplasia, or Mallory-Weiss tear were  appreciated, and there was no evidence of reflux esophagitis, Barrett  esophagus, or free reflux.   The stomach was entered.  It contained a small clear residual, no blood,  coffee grounds, clot, or bile.  On the greater curve just below the GE  junction on a rugal fold, was a roughly 1 x 1.5 cm superficial ulcer  with a small stigma of hemorrhage within it and a slightly dirty base.  There was no protuberant visible vessel, no adherent clot, and certainly  no active bleeding.  Nearby on rugal folds were a couple of  erythematous, edematous but not really eroded areas.   In the distal stomach and what appeared to be the anterior wall, there  was some dimpling of the mucosa suggestive of the site of his previous  cyst gastrostomy.  I did not see any polyps, masses, diffuse gastritis,  or other lesions in the antrum of the stomach.  In retroflexed view, the  cardia was negative for varices or other abnormalities.  The pylorus,  duodenal bulb, and second duodenum looked normal.   Since there was no protuberant visible vessel, I elected not to perform  intervention on the ulcer, especially since it was about 20 hours since  he had last bled.  The scope was, therefore, removed from the patient  who tolerated the procedure well and there were no apparent  complications.   IMPRESSION:  Gastric ulcer, currently nonbleeding, but almost certainly  accounting for the patient's recent hematemesis.  The etiology of this  ulcer is unclear since the patient has not had recent ulcerogenic  medications, did not have diffuse gastritis, etc.   PLAN:  Continue  anti-peptic therapy.  The patient would seem to be at a  fairly low risk of re-bleeding in the next 24 hours, probably under 20%,  since he is now almost 24 hours out from his initial bleed.  I will  consider a repeat endoscopy in 2 months to confirm healing, although it  is controversial whether or not such exams are necessary for benign-  appearing lesions like this.           ______________________________  Bernette Redbird, M.D.     RB/MEDQ  D:  10/28/2008  T:  10/29/2008  Job:  [id number]   cc:   Garnetta Buddy, M.D.  [facility]

## 2010-07-22 NOTE — Procedures (Signed)
DUPLEX ULTRASOUND OF ABDOMINAL AORTA   INDICATION:  Followup of abdominal aortic aneurysm.   HISTORY:  Diabetes:  No  Cardiac:  No  Hypertension:  Yes  Smoking:  No  Family History:  No  Previous Surgery:   DUPLEX EXAM:         AP (cm)                   TRANSVERSE (cm)  Proximal             3.4 cm                    3.5 cm  Mid                  4.06 cm                   4.5 cm  Distal               4.5 cm                    5.2 cm  Right Iliac          1.7 cm                    1.4 cm  Left Iliac           1.3 cm                    1.2 cm   PREVIOUS:  Date: (CT)  AP:  4.2  TRANSVERSE:  4.3   IMPRESSION:  Abdominal aortic aneurysm with largest measurement of 4.5 x  5.2 cm.   ___________________________________________  V. Charlena Cross, MD   CJ/MEDQ  D:  01/28/2009  T:  01/28/2009  Job:  [id number]

## 2010-07-22 NOTE — Assessment & Plan Note (Signed)
OFFICE VISIT   [name], [name]  DOB:  Dec 12, 1929                                       01/17/2007  UXLKG#:[id number]   REASON FOR VISIT:  Follow up aneurysm   HISTORY:  [name] [name] is a 77 year old gentleman who returns today for  further evaluation of his abdominal aortic aneurysm.  I initially saw  [name] [name] in August.  At that time he was referred for evaluation of a 4-  cm aneurysm found incidentally on a CT scan in May.  I had elected to  repeat his CT scan in 6 months from his original date.  That was done  today and he comes back in for followup.  Regarding his aneurysm, he has  not had any abdominal pain.  He does not smoke and continues to manage  his hypertension and hypercholesterolemia with his current medical  regimen.  I was also concerned about lower extremity disease and  cerebrovascular disease.  For that reason, the patient comes back in  today for duplex evaluation.  He has not had any TIAs.  He has not had  any significant rest pain.   PHYSICAL EXAMINATION:  Pulse is 67, respirations 18, blood pressure is  136/92.  GENERAL:  He is well-appearing; no acute distress.  HEENT:  Normocephalic, atraumatic.  Sclerae are anicteric.  CARDIOVASCULAR:  Regular rate and rhythm without murmur.  RESPIRATIONS:  Nonlabored.  ABDOMEN:  Soft, nontender.  There is a midline scar without evidence of  a hernia.  There is no pulsatile mass.  EXTREMITIES:  Warm and well-perfused.  PSYCH:  He is alert and oriented x3.  NEUROLOGIC:  Cranial nerves II-XII are grossly intact.   DIAGNOSTIC STUDIES:  CT scan:  The patient had a noncontrast scan today,  given his renal insufficiency.  This showed that his infrarenal aortic  aneurysm has not grown in size and measures 4 cm in maximal diameter.   Carotid duplex was performed today which reveals 1% to 39% stenosis in  bilateral internal carotid arteries.   Lower extremity duplex was performed today, which revealed  ankle  brachial indices of 1 bilaterally.   ASSESSMENT:  Infrarenal abdominal aortic aneurysm and chronic renal  insufficiency.   PLAN:  Within the 5-month interval of his 2 scans, the patient's  aneurysm has not changed in size.  For that reason, I feel it is safe to  follow him up in 1 year with a repeat CT scan.  Again, this will be  noncontrast given his renal insufficiency.   Jorge Ny, MD  Electronically Signed   VWB/MEDQ  D:  01/17/2007  T:  01/18/2007  Job:  [id number]

## 2010-07-22 NOTE — Consult Note (Signed)
NAME:  Semidey, Gurtaj                ACCOUNT NO.:  [id number]   MEDICAL RECORD NO.:  [id number]          PATIENT TYPE:  INP   LOCATION:  6727                         FACILITY:  [facility]   PHYSICIAN:  Juleen China IV, MDDATE OF BIRTH:  08/13/1929   DATE OF CONSULTATION:  10/19/2008  DATE OF DISCHARGE:                                 CONSULTATION   CONSULTING PHYSICIAN:  Terrial Rhodes, MD   REASON FOR CONSULT:  Abdominal aortic aneurysm.   HISTORY:  Mr. Raden is a 77 year old gentleman that I have been following  for an infrarenal abdominal aortic aneurysm.  I most recently saw him in  November where his aneurysm measured 4.1 cm.  He was to be followed on a  yearly basis.  He presented to [facility] [place] on October 18, 2008, with a 4-month  history of urinary urgency.  He has been treated with antibiotics  recently for urinary tract infection.  He complains of not feeling well  as well as lower abdominal discomfort.  His symptoms of urinary urgency  improved with antibiotic therapy.  He was most recently found to have an  elevated creatinine, and therefore, was sent to the emergency department  for further evaluation.  His creatinine on arrival was 15.  Ultrasound  did not show any evidence of bladder obstruction or hydronephrosis.  Due  to his abdominal pain that it is not readily explained at this time, I  am asked to evaluate him for to see if his aneurysm is contributing to  his pain.  The patient states this pain has been present for  approximately 2 months.  There are no aggravating or relieving factors  that he reports.  He describes it as a right lower quadrant as well as a  periumbilical pain on the left.  It is not a boring pain into his back  but there is a dull tenderness.   Past medical history is positive for:  1. Hypertension.  2. Hyperlipidemia.  3. History of bleeding.  4. Pancreatic cyst, status post laparotomy.  5. Bilateral cataract extraction.  6. History of  cholecystectomy.  7. History of alcohol abuse.  8. Infrarenal abdominal aortic aneurysm.   SOCIAL HISTORY:  He is a former smoker and heavy drinker, none  currently.   Family history is negative for cardiovascular disease in early age.   Medications include hydrochlorothiazide, gemfibrozil, amlodipine, and  sertraline.   Allergies are none.   REVIEW OF SYSTEMS:  Negative except for as noted above in H and P.   PHYSICAL EXAMINATION:  VITAL SIGNS:  Temperature is 98.0, pulse 72,  respirations 16, blood pressure 128/63, O2 sats 97% on room air.  GENERAL:  He is well appearing, in no acute distress.  CARDIOVASCULAR:  Regular rate rhythm.  LUNGS:  Clear bilaterally.  NECK:  Supple without JVD.  ABDOMEN:  Soft.  He has a well-healed midline incision.  There is a mild  tenderness in the right lower quadrant.  He does not have pain with deep  palpation over the area of his aorta.  EXTREMITIES:  Warm and well perfused.  NEURO:  Cranial nerves II through XII are grossly intact.  He has no  focal deficits.  PSYCH:  He is alert and oriented x3.   LABORATORY VALUES:  Potassium is 4.5, creatinine is 14.3.  Hemoglobin is  10, platelet count is 115.   ASSESSMENT AND PLAN:  This is a 77 year old gentleman with a known  infrarenal abdominal aortic aneurysm with 53-month history of abdominal  pain as well as acute renal failure.   PLAN:  I have discussed this with Dr. Arrie Aran.  I feel the best next  step to further evaluate his aneurysm is to proceed with a noncontrasted  CT scan to see if he has any form of a contained leak versus significant  change in his aneurysm size.  Most likely, however, I do not feel that  his aneurysm is contributing to his current symptomatology; however, we  will follow him while he is in the  hospital and make recommendations  based on his CT scan.      Jorge Ny, MD  Electronically Signed     VWB/MEDQ  D:  10/20/2008  T:  10/20/2008  Job:   [id number]

## 2010-07-22 NOTE — Assessment & Plan Note (Signed)
OFFICE VISIT   [name], [name]  DOB:  [birth year]                                       11/15/2006  ZOXWR#:[id number]   REASON FOR REFERRAL:  Abdominal aortic aneurysm.   The patient is a 77 year old gentleman I am seeing in consultation at  the request of Dr. Hyman Hopes for evaluation of abdominal aortic aneurysm.  Patient was found to have a 4-cm abdominal aortic aneurysm on non-  contrast CT scan back in May, done for back pain.  He has subsequently  developed an elevation in his creatinine, for which Dr. Hyman Hopes is  managing.  This seems likely due to his ACE inhibitor.  At this time the  patient has no symptoms from his aneurysm.  Specifically, he has no  abdominal or back pain.   REVIEW OF SYSTEMS:  GENERAL:  His weight is 159 pounds.  CARDIAC:  He  complains of occasional chest pain and shortness of breath with  exertion.  PULMONARY:  He says he has asthma.  GASTROINTESTINAL:  Occasional abdominal pain.  GENITOURINARY:  No dysuria.  VASCULAR:  He  states that after walking long distances his legs tend to get weak.  This is exacerbated by walking uphill.  NEURO:  He denies amaurosis or  weakness/numbness.  ORTHO:  He complains of joint pain.  PSYCHIATRIC:  Nervousness.   PAST MEDICAL HISTORY:  Significant for hypertension,  hypercholesterolemia, chronic renal insufficiency and pancreatitis.   PAST SURGICAL HISTORY:  Cholecystectomy, a pancreas operation, and  bilateral cataracts.   FAMILY HISTORY:  Significant for vascular disease in mother and his  brother.   SOCIAL HISTORY:  He is married.  He does not currently smoke.  He does  have a history of smoking but quit in 1990.  He has a history of alcohol  abuse but not since 1984.   ALLERGIES:  None.   MEDICATIONS:  1. Hydrochlorothiazide 25 mg once a day.  2. Amlodipine 10 mg once a day.  3. Paroxetine 4 mg once a day.  4. Tricor 145 mg once a day.  5. Acid reducer 150 mg once a day.  6. Tandem Plus  once a day.   PHYSICAL EXAMINATION:  Vital signs:  Heart rate 78, blood pressure  152/78, respirations 18.  General:  He is well appearing, no obvious  distress.  HEENT:  Normocephalic and atraumatic.  No carotid bruits.  Cardiovascular:  Regular rate and rhythm.  Respirations:  Clear to  auscultation bilaterally.  Abdomen:  Soft, nontender.  There is a well  healed midline scar.  Extremities:  He has palpable femoral pulses  bilaterally.  His extremities are warm and well perfused.  Neurologic:  No cranial nerve deficits.   ASSESSMENT/PLAN:  This is a 77 year old gentleman with a 4-cm  asymptomatic abdominal aortic aneurysm, diagnosed incidentally by CT  scan.   Plan at this time, given the patient's renal insufficiency, I will start  with obtaining a non-contrast at 2-mm-cut CT scan.  We will do this 6  months following his preceding CT which was done in May, so he will have  this in November.  He will follow up to see me after this has been done.  At that time he will also undergo carotid duplex as well as lower  extremity arterial studies.   We discussed the size of the aneurysm today  as well as the indications  for repair as well as the symptoms of rupture.  The patient if he has  any difficulty has been instructed to call me.  Otherwise I will see him  in November.   Jorge Ny, MD  Electronically Signed   VWB/MEDQ  D:  11/15/2006  T:  11/16/2006  Job:  [id number]

## 2010-07-22 NOTE — H&P (Signed)
NAME:  [name] [name], [name] [name]                ACCOUNT NO.:  [id number]   MEDICAL RECORD NO.:  [id number]          PATIENT TYPE:  INP   LOCATION:  6708                         FACILITY:  [facility]   PHYSICIAN:  Della Goo, M.D. DATE OF BIRTH:  [birth year]   DATE OF ADMISSION:  10/28/2008  DATE OF DISCHARGE:                              HISTORY & PHYSICAL   PRIMARY CARE PHYSICIAN:  Teena Irani. Arlyce Dice, M.D.   CHIEF COMPLAINT:  Vomiting blood.   HISTORY OF PRESENT ILLNESS:  A 77 year old male who presented to the  emergency department secondary to complaints of sudden onset of  hematemesis.  He reports emesis that was bloody and red in color, and  ranged to coffee grounds and dark brown color.  The patient stated that  he was awakened by discomfort that felt like indigestion, and began to  vomit at about 11:00 p.m.  He also reports having a heartburn sensation.  He denies having any diarrhea or passing any bloody stool.  He denies  having any chest pain or shortness of breath, lightheadedness, syncope.   The patient was last hospitalized October 18, 2008 for acute renal  failure/ acute interstitial nephritis.  He was managed at that time by  the Renal Service.   When the patient was hospitalized recently, his hemoglobin level on  October 21, 2008 was 10.2.  On return to the Emergency Department this  evening the patient's hemoglobin was found to be 8.7.   PAST MEDICAL HISTORY:  As mentioned above, also history of:  1. Hypertension.  2. Hyperlipidemia.  3. Abdominal aortic aneurysm.  4. Previous bleeding pancreatic cyst, status post excision.  5. Status post exploratory laparotomy and surgical repair.  6. Status post cholecystectomy.  7. Status post bilateral cataract surgeries.  8. Chronic renal insufficiency.   MEDICATIONS AT THIS TIME:  1. Amlodipine 10 mg one p.o. daily.  2. Calcitriol 0.25 mg p.o. daily.  3. Gemfibrozil 600 mg p.o. b.i.d.  4. PhosLo 667 mg t.i.d. with meals.  5.  Sertraline 150 mg p.o. daily   ALLERGIES:  No known drug allergies.   SOCIAL HISTORY:  The patient has remote history of alcohol and tobacco  abuse.   FAMILY HISTORY:  Positive for congestive heart failure in his mother.  No history of diabetes, hypertension or cancer in his family that he  knows of.   REVIEW OF SYSTEMS:  Pertinents are mentioned above.   PHYSICAL EXAMINATION FINDINGS:  This is a 77 year old elderly male, in  no visible discomfort or acute distress currently.  VITAL SIGNS:  Temperature 97.2, blood pressure 104/64, heart rate 77, respirations 16,  O2 saturations 100%.  HEENT:  Normocephalic, atraumatic.  Pupils equally round and reactive to  light.  Extraocular movements are intact.  Funduscopic benign.  There is  no scleral icterus.  Nares are patent bilaterally.  Oropharynx is clear.  NECK:  Supple, full range of motion.  No thyromegaly, adenopathy or  jugular venous distention.  CARDIOVASCULAR:  Regular rate and rhythm.  Normal S1 and S2.  No  murmurs, gallops or rubs.  LUNGS:  Clear to  auscultation bilaterally.  ABDOMEN:  Positive bowel sounds; soft, nontender and nondistended.  EXTREMITIES:  Without cyanosis, clubbing or edema.  NEUROLOGIC:  The patient is alert and oriented x3.  Cranial nerves are  intact.  There are no focal deficits.   LABORATORY STUDIES:  White blood cell count 7.7, hemoglobin 8.7,  hematocrit 25.4, MCV 95.4, platelets 170, neutrophils 69%, lymphocytes  15%.  Sodium 139, potassium 3.8, chloride 112, CO2 19, BUN 62,  creatinine 6.6, glucose 125.  Prothrombin Time 15.6, INR 1.3, PTT 30.  Fecal occult blood testing negative.  Urinalysis:  With urine glucose  250, small urine hemoglobin, 100 total protein, and trace leukocyte  esterase.  Urine Microscopic:  3-6 white blood cells, 3-6 red blood  cells, rare bacteria.   ASSESSMENT:  A 77 year old male being admitted with:  1. Hematemesis/gastrointestinal bleeding.  2. Anemia.  3. Chronic  kidney disease/chronic renal insufficiency.   PLAN:  The patient will be admitted to the telemetry area for  monitoring.  The patient will be placed on IV fluids for fluid  resuscitation and maintenance therapy.  Clear liquids have been ordered  for now.  IV Protonix therapy has also been ordered.  Two units of  packed red blood cells have been placed on hold, in the event the  patient needs transfusion.  Serial hemoglobin and hematocrit levels will  also be performed to monitor for further blood loss.  A gastrointestinal  consultation will be requested.  SCDs will be ordered for deep vein  thrombosis prophylaxis.  The patient's regular medications will also be  continued.      Della Goo, M.D.  Electronically Signed     HJ/MEDQ  D:  10/28/2008  T:  10/28/2008  Job:  [id number]   cc:   Teena Irani. Arlyce Dice, M.D.

## 2010-07-22 NOTE — Consult Note (Signed)
NAME:  [name] [name], [name] [name]                ACCOUNT NO.:  [id number]   MEDICAL RECORD NO.:  [id number]          PATIENT TYPE:  INP   LOCATION:  6708                         FACILITY:  [facility]   PHYSICIAN:  Garnetta Buddy, M.D.   DATE OF BIRTH:  August 24, 1929   DATE OF CONSULTATION:  10/28/2008  DATE OF DISCHARGE:                                 CONSULTATION   REASON FOR CONSULTATION:  Chronic kidney disease.   HISTORY OF PRESENT ILLNESS:  This is a 77 year old white male with a  history of hospital discharge on October 24, 2008 for acute renal failure  secondary to AIN who presents with new-onset hematemesis x3 last night.  The patient was admitted per hospitalist team, and GI was consulted who  planned to take the patient to endoscopy this afternoon.  The patient  has had no further episodes of emesis since admission.  He feels well  with no chest pain, shortness of breath, lightheadedness, fever,  diarrhea or urinary retention.  He had some mild abdominal pain in the  lower abdomen with pulls himself up.  Currently, he is receiving 2 units  of packed red blood cells, as he had a hemoglobin of 8.7 on admission  which had trended downward to 7.4 this morning.   The patient has a history of mild chronic renal insufficiency with  baseline creatinine of 1.5 two months ago per previous discharge  summary.  The patient was admitted October 18, 2008 through October 24, 2008 with acute renal failure secondary to AIN in the setting of  decreased p.o. intake, Bactrim and Cipro use for a urinary tract  infection.  During this hospitalization, the patient had a creatinine as  high as 15.36 which trended down one point per day to 8.18 at discharge.  Workup for other causes of acute renal failure were negative.   PAST MEDICAL HISTORY:  1. Hypertension.  2. Hyperlipidemia.  3. 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  4. Left inguinal hernia.  5. History of bleeding pancreatic cyst.  6. History of  alcoholism.  7. Depression.  8. Secondary hyperparathyroid hormone.   PAST SURGICAL HISTORY:  1. Cholecystectomy.  2. Bilateral cataracts.  3. Status shows laparoscopy and surgical repair of pancreas cyst.   ALLERGIES:  No known drug allergies.   HOME MEDICATIONS:  1. PhosLo 665 mg one p.o. with meals t.i.d.  2. Calcitriol 0.25 mcg p.o. daily.  3. Lopid 600 mg p.o. b.i.d.  4. Norvasc 10 mg p.o. daily.  5. Zoloft 50 mg p.o. b.i.d.   CURRENT MEDICATIONS:  The same as above except for Norvasc is currently  held.  The patient is also on Zofran, Dilaudid, coated, Phenergan,  Fleet's enema   SOCIAL HISTORY:  The patient is a former smoker and drinker.  He lives  at home with his wife.   FAMILY HISTORY:  Noncontributory.   REVIEW OF SYSTEMS:  Negative except as per HPI.   PHYSICAL EXAMINATION:  VITAL SIGNS:  Temperature 97.9, heart rate 59-70,  respirations 14-18, blood pressure 108/58, 100% on room air.  GENERAL:  Alert and oriented  x4, pleasant, well-appearing.  CARDIOVASCULAR:  Regular rate and rhythm.  No murmurs.  RESPIRATORY:  Clear to auscultation bilaterally.  ABDOMEN:  Positive bowel sounds, soft, nondistended, nontender.  EXTREMITIES:  Left edema.   LABORATORY DATA:  1. TSH 2.61.  2. CBC - white blood count 7.9, hemoglobin 8.7 trending down to 7.4,      platelets 170, 69% neutrophils.  3. Fecal occult blood negative.  4. PT 1.3.  5. Urinalysis yellow, cloudy, specific gravity 1.013, 250 glucose, 15      ketones, small blood, 100 protein, negative nitrates, trace      leukocytes.  Microscopy showed granular casts, 0-3 white blood      cells, 3-6 red blood cells, rare bacteria.  6. BMET - sodium 140, potassium 3.7, chloride 110, bicarbonate 18, BUN      60, creatinine 6.75, glucose 125, calcium 8.7.   ASSESSMENT AND PLAN:  A 77 year old with recent hospitalization for  acute renal failure secondary to presumed AIN, now admitted with  gastrointestinal bleed.  1.  Renal.  The patient diagnosed with acute renal failure on      hospitalization on October 18, 2008 through October 24, 2008 with      continued downward trend in creatinine.  We will continue to avoid      nephrotoxic agents, follow decrease in creatinine and monitor urine      output.  The patient has a follow-up appointment with Dr. Hyman Hopes on      November 01, 2008 at 1:30 p.m.  2. Gastrointestinal bleed.  The patient getting 2 units of packed red      blood cells and will be taken to endoscopy per GI today.  3. Anemia, acute on chronic, secondary to acute blood loss.  4. Secondary hypoparathyroidism.  Parathyroid hormone on October 19, 2008 was 197.1.  The patient is on PhosLo and calcitriol.  5. Depression on SSRI  6. Hypertension.  Norvasc held secondary to low blood pressure.  7. Dyslipidemia.  The patient will continue on Lopid.   Thank you for the consultation.      Delbert Harness, MD  Electronically Signed      Garnetta Buddy, M.D.  Electronically Signed    KB/MEDQ  D:  10/28/2008  T:  10/28/2008  Job:  [id number]

## 2010-07-22 NOTE — Procedures (Signed)
DUPLEX ULTRASOUND OF ABDOMINAL AORTA   INDICATION:  Follow up abdominal aortic aneurysm.   HISTORY:  Diabetes:  No.  Cardiac:  No.  Hypertension:  Yes.  Smoking:  No.  Connective Tissue Disorder:  Family History:  No.  Previous Surgery:  No.   DUPLEX EXAM:         AP (cm)                   TRANSVERSE (cm)  Proximal             3.10 cm                   3.14 cm  Mid                  4.09 cm                   4.30 cm  Distal               4.45 cm                   4.74 cm  Right Iliac          1.49 cm                   1.52 cm  Left Iliac           1.32 cm                   1.43 cm   PREVIOUS:  Date: 01/28/2009  AP:  4.5  TRANSVERSE:  5.2   IMPRESSION:  Essentially stable abdominal aortic aneurysm compared to  previous studies with the largest measurement today of 4.45 cm X 4.74  cm.   ___________________________________________  V. Charlena Cross, MD   AS/MEDQ  D:  07/08/2009  T:  07/09/2009  Job:  [id number]

## 2010-07-22 NOTE — Procedures (Signed)
CAROTID DUPLEX EXAM   INDICATION:  Followup carotid artery disease   HISTORY:  Diabetes:  No  Cardiac:  No  Hypertension:  Yes  Smoking:  Quit 18 years ago  Previous Surgery:  No  CV History:  No  Amaurosis Fugax No, Paresthesias No, Hemiparesis No                                       RIGHT             LEFT  Brachial systolic pressure:         148               157  Brachial Doppler waveforms:         Biphasic          Biphasic  Vertebral direction of flow:        Antegrade         Antegrade  DUPLEX VELOCITIES (cm/sec)  CCA peak systolic                   63                64  ECA peak systolic                   91                52  ICA peak systolic                   68                54  ICA end diastolic                   15                14  PLAQUE MORPHOLOGY:                  Heterogeneous     Heterogeneous  PLAQUE AMOUNT:                      Mild              Mild  PLAQUE LOCATION:                    ICA and ECA       ICA and ECA   IMPRESSION:  1. One to 39% stenosis noted in bilateral ICAs.  2. Antegrade bilateral vertebral arteries.   ___________________________________________  V. Charlena Cross, MD   MG/MEDQ  D:  01/17/2007  T:  01/18/2007  Job:  [id number]

## 2010-07-22 NOTE — H&P (Signed)
NAME:  [name] [name], [name] [name]                ACCOUNT NO.:  [id number]   MEDICAL RECORD NO.:  [id number]          PATIENT TYPE:  INP   LOCATION:  6727                         FACILITY:  [facility]   PHYSICIAN:  Alvin C. Lowell Guitar, M.D.  DATE OF BIRTH:  [birth year]   DATE OF ADMISSION:  10/18/2008  DATE OF DISCHARGE:                              HISTORY & PHYSICAL   [name] [name] is a 77 year old male with a 53-month history of urgency.  He is  followed at the [facility] and was seen there  approximately 1 month ago and treated with antibiotics with a diagnosis  of urinary tract infection.  Initially, he received Bactrim and  subsequently this was switched to Cipro.  He has felt poorly over the  last month with a complaint of malaise as well as lower abdominal  discomfort.  Urgency symptomatology improved with antibiotic therapy.  He was seen yesterday at the [facility] and labs were done  and serum creatinine was elevated.  The patient was sent to the  emergency room with a diagnosis of renal failure.  The patient denies  any recent NSAID, contrast dye, or ACE inhibitor therapy.  Of note, in  August 23, 2008, serum creatinine was 1.58 mg/dL.  At [facility]  Emergency Room urinalysis revealed too numerous to count white blood  cells, BUN 107, creatinine 15.36, and bicarb 13.  Bladder scan  apparently did not show any evidence of obstruction.  It should be noted  that a CT scan done in November 2009 revealed an atrophic right kidney  and a 4.5 cm low-attenuated lesion in the area of the left kidney.  The  patient admits to decreased oral intake and lack of appetite and  recently some dizziness.  He has seen Dr. Hyman Hopes within the past year for  apparent chronic kidney disease.   PAST HISTORY:  1. Hypertension.  2. Dyslipidemia.  3. Infrarenal abdominal aortic aneurysm.  4. Bleeding pancreatic cyst status post laparotomy and surgical      repair.  5. Bilateral  cataract excision.  6. Status post cholecystectomy.  7. History of alcoholism in the past.   CURRENT MEDICATIONS:  Hydrochlorothiazide, gemfibrozil, amlodipine, and  sertraline.   SOCIAL HISTORY:  He is a former smoker and heavy drinker.  He quit both  in the past.   FAMILY HISTORY:  Negative for kidney disease.   REVIEW OF SYSTEMS:  Remarkable for nocturia that was treated for, which  improved after he started the antibiotic therapy.   PHYSICAL EXAMINATION:  VITAL SIGNS:  His blood pressure is 124/71 and  afebrile.  GENERAL:  He is a pleasant, fairly well-appearing Caucasian male.  NECK:  Veins are flat.  LUNGS:  Clear.  HEART:  Regular rhythm and rate.  ABDOMEN:  Soft, minimal tenderness.  Midline abdominal scar, well  healed.  EXTREMITIES:  Without significant edema.  No asterixis.  NEUROLOGIC:  Oriented in all spheres.   ASSESSMENT:  1. Renal failure - acute superimposed on chronic kidney disease.  2. Atrophic right kidney.  3. History of infrarenal abdominal  aortic aneurysm.  4. Urinary tract infection.   PLAN:  1. Renal ultrasound.  2. Intravenous hydration.  3. Check orthostatic VITAL SIGNS.  4. Serum protein electrophoresis, CK level, urine C and S.  5. We need medical records from [facility].           ______________________________  Mindi Slicker Lowell Guitar, M.D.     ACP/MEDQ  D:  10/18/2008  T:  10/19/2008  Job:  [id number]

## 2010-07-22 NOTE — Discharge Summary (Signed)
NAME:  [name] [name], [name] [name]                ACCOUNT NO.:  [id number]   MEDICAL RECORD NO.:  [id number]          PATIENT TYPE:  INP   LOCATION:  6727                         FACILITY:  [facility]   PHYSICIAN:  Terrial Rhodes, M.D.DATE OF BIRTH:  04-30-1929   DATE OF ADMISSION:  10/18/2008  DATE OF DISCHARGE:  10/24/2008                               DISCHARGE SUMMARY   PRIMARY NEPHROLOGIST:  Garnetta Buddy, MD   DISCHARGE DIAGNOSES:  1. Acute renal failure secondary to likely acute interstitial      nephritis.  2. Metabolic acidosis.  3. Hypokalemia.  4. Diarrhea.  5. Hypertension.  6. Depression.  7. Secondary parathyroid hormone.  8. Infrarenal abdominal aortic aneurysm.  9. Hyperlipidemia.   DISCHARGE MEDICATIONS:  1. PhosLo 667 mg one p.o. before every meal.  2. Calcitriol 0.25 mcg one p.o. daily.  3. Lopid 600 mg p.o. b.i.d.  4. Norvasc 10 mg p.o. daily.  5. Zoloft 50 mg p.o. b.i.d.  6. Imodium 1 tablet after each loose stool p.r.n.  Do not take more      than 6 tablets in a day.   DISCONTINUE MEDICATIONS:  Hydrochlorothiazide secondary to acute renal  failure.   CONSULTS:  Vascular Surgery secondary to abdominal pain on admission and  history of AAA.   PROCEDURES:  None.   IMAGING:  1. CT of abdomen and pelvis October 19, 2008; impression; stable left-      sided pleural calcification, thoracoabdominal aneurysm with slight      increase in size 4.2 x 4.7, no hydronephrosis, left parapelvic      cyst, no acute pelvic findings, diverticulosis of the sigmoid      colon, left inguinal hernia containing fat.  2. Renal ultrasound October 19, 2008; impression; stable appearance of      kidneys, no hydronephrosis, left kidney measures 10.7 cm, right      kidney 9.3 cm.   DISCHARGE LABORATORY DATA:  Sodium 142, potassium 4.0, BUN 50,  creatinine 8.18, phosphorus 5.0, calcium 9.0, albumin 2.9.  C. diff  negative x3.  A 24-hour urine Bence Jones proteins negative, ANA  negative.   PTH 197.1.   ADMISSION LABORATORY DATA:  Fecal occult blood negative.  Urinalysis  trace ketones, small blood, protein 100, leukocytes small, microscopy  too numerous to count white blood cell, creatinine 15.36.   BRIEF HOSPITAL COURSE:  A 77 year old male with history of mild chronic  renal insufficiency, baseline creatinine 1.5 approximately 2 months  prior to admission, admitted with acute renal failure and abdominal  pain.  1. Acute renal failure.  Upon admission for abdominal pain as well as      decreased p.o. intake, the patient's creatinine was found to be      elevated at 15.36.  Further review of history it was found that the      patient was on Bactrim and Cipro for recent UTI.  Working diagnosis      at discharge was acute interstitial nephritis likely secondary to      both of these antibiotics.  The patient also had a decreased p.o.  intake and was also taking thiazide which may have also contributed      to further renal damage.  The patient was gently hydrated      throughout admission with addition of bicarb and some fluids      secondary to metabolic acidosis.  The patient's creatinine trended      down 1 point a day to a discharge creatinine of 8.18 from admission      of 15.36 although the patient's abdominal pain and other systemic      symptoms have also resolved prior to discharge.  The patient's      creatinine trended down one point after being off of IV fluids for      24 hours.  Decision was made the patient will follow up as an      outpatient with primary nephrologist.  Of note, other causes for      glomerular disease were ruled out such as ANA, complement levels      and SPEP, UPEP as well as multiple myeloma.  There was also no      evidence of urinary tract infection with a negative urine culture      on October 18, 2008.  2. Hypokalemia.  The patient had hypokalemia throughout admission      which was repleted p.o.  Most of the patient's hypokalemia  was      likely secondary to three days of diarrhea.  At discharge, the      patient's potassium fell today at 4.0.  The patient will need      repeat labs in the clinic to make sure electrolytes are within      normal limits.  3. Hyperphosphatemia.  The patient had hyperphosphatemia related to      renal disease was started on binders with each meal.  Phosphorus      was initially 11.4 on admission, at discharge phosphorus was 5.0.      The patient will continue PhosLo with each meal.  4. Abdominal pain.  On admission, the patient had abdominal pain,      concern for acute abdominal process especially with history of      triple AAA.  Vascular Surgery was consulted after CT scan revealed      a large abdominal aortic aneurysm.  This was thought to be stable      and therefore Vascular recommended followup as an outpatient with      repeat imaging in approximately 6 months.  The patient's abdominal      pain improved throughout admission, however, began to have      diarrhea.  This was initially thought to be likely secondary to      antibiotic use especially three antibiotics within the last month.      C. difficile was tested and was negative x3.  The patient did not      have any fever or leukocytosis and was also without abdominal pain,      therefore, primary team decided to give Imodium to stop diarrhea.      The patient's diarrhea had resolved 24 hours prior to discharge.  5. Hypertension.  The patient's blood pressure was stable throughout      admission.  Hydrochlorothiazide was initially held secondary to      acute renal failure.  The patient was continued on Norvasc and will      continue at discharge.  6. Depression.  The patient has history of depression was  continued on      home meds of Zoloft, mood was stable throughout admission.  7. Secondary PTH.  The patient's PTH levels were drawn.  The patient      was started on calcitriol 0.25 mcg p.o. daily.  Throughout       admission, PTH levels as well as calcium and phosphorous will need      to be followed up as an outpatient.   DISCHARGE INSTRUCTIONS:  1. The patient is to discontinue hydrochlorothiazide, avoid any      nephrotoxic agents such as Advil or ibuprofen.  2. The patient will follow up with primary nephrologist for routine      labs and outpatient visit to ensure that creatinine continues to      trend down towards normal.  3. The patient with history of urinary retention will follow with      primary urologist as previously scheduled.   FOLLOWUP APPOINTMENTS:  1. Urology per above.  2. Dr. Hyman Hopes, Nephrology November 01, 2008 at 1:30 p.m.   DISCHARGE CONDITION:  Stable.   DISCHARGE LOCATION:  Home.      Milinda Antis, MD  Electronically Signed     ______________________________  Terrial Rhodes, M.D.    KD/MEDQ  D:  10/25/2008  T:  10/26/2008  Job:  [id number]   cc:   Garnetta Buddy, M.D.

## 2010-07-22 NOTE — Assessment & Plan Note (Signed)
OFFICE VISIT   [name], [name]  DOB:  08-15-1929                                       01/23/2008  ZOXWR#:[id number]   REASON FOR VISIT:  Follow-up aneurysm.   HISTORY:  This is a 77 year old gentleman that I am following for  infrarenal abdominal aortic aneurysm.  The patient went for a CT scan  today, comes in for further discussion.  He denies having any abdominal  pain.  He has had no change in his levels issues since I last saw him.   PHYSICAL EXAMINATION:  His blood pressure is 135/70, pulse 68,  respirations 18.  General:  He is well-appearing, in no distress.  Cardiovascular:  Regular rate and rhythm.  No murmurs, rubs or gallops.  Pulmonary:  Lungs are clear bilaterally.  Neck:  Without carotid bruits.  Abdomen:  Soft, nontender.  No pulsatile mass appreciated.   Diagnostic studies:  The patient had a noncontrast CT scan today.  This  shows minimal progression in his aneurysm, measures approximately 4.1  cm.   ASSESSMENT/PLAN:  Asymptomatic small infrarenal abdominal aortic  aneurysm.  Plan:  We will continue to watch the patient's aneurysm.  I  will see him back in 1 year.  We will get an ultrasound at that time.  The patient does have a history of portal vein thrombosis and so we will  need to consider this when discussing repair if he comes to this.   [name] Ny, MD  Electronically Signed   VWB/MEDQ  D:  01/23/2008  T:  01/24/2008  Job:  [id number]   cc:   Dr. Arlyce Dice

## 2010-07-25 NOTE — Procedures (Signed)
[place]. [facility]  Patient:    [name], [name] Visit Number: [id number] MRN: [id number]          Service Type: MED Location: [phone] Attending Physician:  Arlis Porta Dictated by:   Florencia Reasons, M.D. Proc. Date: 09/23/01 Admit Date:  09/22/2001   CC:         Elvina Sidle, M.D.  Adolph Pollack, M.D.   Procedure Report  PROCEDURE PERFORMED:  Upper endoscopy.  ENDOSCOPIST:  Florencia Reasons, M.D.  INDICATIONS FOR PROCEDURE:  The patient is a 77 year old gentleman recently status post cyst gastrostomy for pancreatic pseudocyst, who developed GI bleeding approximately two days ago.  Endoscopy yesterday evening showed a large amount of blood in the stomach but no clear source of bleeding.  The procedure was being done again this morning with the hope that the blood has cleared out of the stomach somewhat to allow better visualization.  FINDINGS:  Large denuded, ulcerated, superficially necrotic area on the posterior wall of the distal stomach, consistent with the region of his cyst gastrostomy, not actively bleeding at the time of this examination.  DESCRIPTION OF PROCEDURE:  The nature, purpose and risks of the procedure had been discussed with the patients wife, who provided consent on her husbands behalf.  The procedure was done at the bedside in the intensive care unit.  Sedation with Versed 3 mg IV was given along with some topical pharyngeal anesthesia with Cetacaine spray.  The Olympus adult video endoscope was passed under direct vision, entering the esophagus easily.  There was liquid red blood refluxing back up into the esophagus but the esophagus itself appeared normal without any evidence of varices, infection, neoplasia or any Mallory-Weiss tear or esophagitis .  The stomach was entered.  There was a huge lake of red blood and clotted material occupying the fundus and greater curve of the majority of the  stomach but much of the lesser curve and antral region were clear.  Examination demonstrated that there was a large denuded, ulcerated, superficially necrotic area on the posterior of the distal stomach, consistent with the region of his cystic gastrostomy.  Although this area looked hemorrhagic, I did not see any active bleeding the time of the exam.  The antrum, pylorus, duodenal bulb and second duodenum looked normal.  Dr. Abbey Chatters was able to observe the findings of this procedure.  The scope was removed from the patient after obtaining photographs.  It was not felt that endoscopic intervention was suitable for this rather broad area which probably measured 6 cm or so in greatest dimension.  IMPRESSION:  Bleeding source is probably from the region of the cyst gastrostomy, as discussed above, but it does not appear there was active bleeding at the moment of this examination.  PLAN:  Surgical management since the patient has now been through approximately 7 units of blood, has a borderline blood pressure, and evidence of recurrent ongoing bleeding with frequent hematemesis in the later hours of last night.  PLAN: Dictated by:   Florencia Reasons, M.D. Attending Physician:  Arlis Porta DD:  09/23/01 TD:  09/28/01 Job: [id number]

## 2010-07-25 NOTE — H&P (Signed)
. [facility]  Patient:    Cardoza, Calvin Visit Number: [id number] MRN: [id number]          Service Type: MED Location: [id number] Attending Physician:  Arlis Porta Dictated by:   Jimmye Norman, M.D. Admit Date:  09/22/2001   CC:         Adolph Pollack, M.D.  Florencia Reasons, M.D.   History and Physical  PRIMARY CARE PHYSICIANS: 1. Dr. Adolph Pollack. 2. Dr. Katy Fitch. Buccini.  IDENTIFICATION AND CHIEF COMPLAINT:  The patient is a 77 year old gentleman, status post cystgastrostomy and cholecystectomy on the 7th of July, who now comes after being discharged on Sunday with anemia and GI bleed.  HISTORY OF PRESENT ILLNESS:  The patient was discharged on Sunday after cystgastrostomy, which is approximately five days status post cystgastrostomy, and a cholecystectomy.  He was doing well at that time, was eating well and only developed problems yesterday after he had developed some mild abdominal discomfort and started taking Advil to help him with his pain, which was recommended to him; he was taking two tablets every four hours and subsequently developed some diaphoresis, lightheadedness, abdominal discomfort, nausea, vomiting and some diarrhea.  He started vomiting sort of a gelatinous, blackish-purplish fluid, seemingly blood, but no bright red blood was vomited.  After not doing well initially, he called the doctor on call for our surgical group, who recommended the patient come into the ED, however, at that time, the patient did not want to; he did come in this morning after failing to improve.  PAST MEDICAL HISTORY:  Significant for hypertension and pancreatitis.  He has no diabetes, coronary disease.  He has had no previous MIs or CVAs.  PAST SURGICAL HISTORY: 1. Cystgastrostomy and cholecystectomy. 2. He has had oral surgery. 3. He has had cataracts.  ALLERGIES:  He has no known drug allergies.  MEDICATIONS: 1.  Lotrel 5 to 10 mg p.o. q.d. 2. He was also taking Advil.  No other medications that he had previously taken.  PHYSICAL EXAMINATION:  VITAL SIGNS:  He came in hypotensive at 80/40.  He has come up to as high as 121/57 with boluses of fluid.  He is afebrile at 97.6.  His pulse is 80 to 90. No urine output since he has been here.  His O2 saturations were 97%.  CHEST:  Clear.  CARDIAC:  Regular rhythm and rate with a grade 2/6 murmur at the left lower sternal border.  ABDOMEN:  Mildly distended but nontender.  He has some bowel sounds but they are decreased.  RECTAL:  Exam was not done.  LABORATORY AND ACCESSORY DATA:  Laboratory studies show a hemoglobin of 5.9, hematocrit of 22.6, his white count was 25.9 with a left chest; BUN 44, creatinine of 2.1.  His PT is 18.1 seconds and he is not taking Coumadin. Albumin is less than 2.0.  Alkaline phosphatase is mildly elevated but a total bilirubin is normal.  Lipase and amylase are both normal.  Plain film of the abdomen demonstrates sort of a collection of air and sort of an amorphous material sort of in the mid-abdomen with the possibility of this being some type of collection in the cyst and/or an abscess.  The radiologist is concerned about an abscess, therefore, a CT scan is being done; this is being done with oral contrast.  In the meantime, the patient will get blood for his anemia and also will possibly require some FFP for  his coagulopathy. We will see how this improves with just hydration.  ASSESSMENT AND PLAN:  The plan is to admit him to Dr. Abbey Chatters, he does not need telemetry bed and currently I do not feel as though he needs in intensive care unit.  He looks better than his stated clinical examination, however, we will recheck him closely once he is admitted. Dictated by:   Jimmye Norman, M.D. Attending Physician:  Arlis Porta DD:  09/22/01 TD:  09/25/01 Job: [id number]

## 2010-07-25 NOTE — Procedures (Signed)
[place]. [facility]  Patient:    [name] [name], [name] [name] Visit Number: [id number] MRN: [id number]          Service Type: MED Location: [id number] Attending Physician:  Arlis Porta Dictated by:   Florencia Reasons, M.D. Proc. Date: 09/22/01 Admit Date:  09/22/2001   CC:         Adolph Pollack, M.D.  Elvina Sidle, M.D.   Procedure Report  PROCEDURE:  Upper endoscopy.  INDICATION:  A 77 year old gentleman recently status post a surgical cyst gastrostomy for drainage of an enlarging, symptomatic pancreatic pseudocyst which occurred after a Mccormac of severe pancreatitis earlier this year. Approximately 24 hours ago he began having hematemesis and melanic stools on multiple occasions.  His admission hemoglobin was 5.9.  FINDINGS:  Large amount of fresh and old clotted blood in the stomach.  No definite bleeding site isolated.  DESCRIPTION OF PROCEDURE:  The nature, purpose, and risks of the procedure had been discussed with the patient, who provided written consent.  The patient was brought from his hospital room to the endoscopy unit and given sedation of Versed 2.5 mg IV.  He remained basically clinically stable throughout the procedure.  The Olympus video endoscope was passed under direct vision.  The vocal cords were not well-seen.  During the first five minutes or so of this procedure, each time I passed the scope down into the stomach, the patient would retch up large amounts of blood and clotted blood.  I estimate that the blood loss in this fashion was several hundred cubic centimeters.  Upon entering the stomach we probably suctioned out about another 300-500 cc of liquid dark blood "motor oil," and there was a large amount of clotted blood, both old clot and fresh red clot, present.  After more or less clearing out the liquid portion of the stomach contents, it did not appear that there was any active bleeding as evidenced by the  apparent absence of "welling up" of blood into the stomach during the procedure.  It is felt that the esophagus, pylorus, and duodenal bulb were fairly well cleared of any pathologic source of bleeding such as varices or ulcer disease. The second duodenum also looked clear, but a part of it was obscured by the presence of some clotted blood within the duodenal lumen.  It appeared that that blood was just sitting there and was not tethered to any lesion.  The cardia of the stomach also looked normal on retroflex viewing.  The midportion of the stomach had what appeared to be an old mature clot as well as a fresh red clot adherent and tethered to the posterior wall of the stomach, possibly corresponding to the suture line of the patients cyst gastrostomy.  The fundus of the stomach had a large amount of clotted blood as well.  For what it is worth, no discrete erosions or frank gastritis were observed.  There was no clearly defined etiology for the bleeding, even though the localization of it appears to be as described above from the region of the posterior wall of the midstomach.  Due to the lack of a specific point source of bleeding, no endoscopic intervention such as clipping, injection, or cautery was attempted.  Adolph Pollack, M.D., the patients attending surgeon, was able to be present throughout the entire procedure.  The patient tolerated the procedure quite well, and there were no apparent complications.  IMPRESSION: 1. Evidence of large amount of recent bleeding but  no definite active bleeding    at the time of this procedure. 2. The most likely source of the bleeding appears to be from the posterior    wall of the stomach, possibly the suture line of the cyst gastrostomy.  PLAN:  If the patient remains stable tonight, the plan is to repeat his endoscopy in the morning to hopefully obtain better visualization of the source of bleeding, with the hope that much of  the clotted material may have moved out in the meantime.  If the patient becomes unstable tonight due to recurrent bleeding, he may have to go to surgery.  In the meantime, he will get intensive antipeptic therapy and supportive care with blood products. Dictated by:   Florencia Reasons, M.D. Attending Physician:  Arlis Porta DD:  09/22/01 TD:  09/28/01 Job: [id number]

## 2010-07-25 NOTE — Op Note (Signed)
[place]. [facility]  Patient:    Lintner, Jessie Visit Number: [id number] MRN: [id number]          Service Type: SUR Location: [phone] Attending Physician:  Arlis Porta Dictated by:   Adolph Pollack, M.D. Proc. Date: 09/12/01 Admit Date:  09/12/2001   CC:         Florencia Reasons, M.D.  Elvina Sidle, M.D.   Operative Report  PREOPERATIVE DIAGNOSIS: 1. Cholelithiasis. 2. Pancreatic pseudocyst.  POSTOPERATIVE DIAGNOSIS: 1. Cholelithiasis. 2. Pancreatic pseudocyst.  OPERATION PERFORMED: 1. Open cholecystectomy with intraoperative cholangiogram. 2. Internal drainage of pancreatic pseudocyst by way of cyst gastrostomy.  SURGEON:  Adolph Pollack, M.D.  ASSISTANT:  Chevis Pretty, M.D.  ANESTHESIA:  General.  INDICATIONS FOR PROCEDURE:  Mr. Brower is a 77 year old male who had pancreatitis back in March which is felt to be biliary in origin.  He developed a pseudocyst which became symptomatic and now presents for the above elective operations.  DESCRIPTION OF PROCEDURE:  He was brought to the operating room and placed supine on the operating table and a general anesthetic was administered.  The abdomen was sterilely prepped and draped.  An upper midline incision was made and coursed circumferentially around the umbilicus.  The skin was incised sharply and the subcutaneous tissues, fascia and peritoneum were divided with cautery.  Once entering the abdominal cavity, I noted the fullness from the cyst and it appeared to be adherent to the posterior aspect of the antrum of the stomach and going down toward the head of the pancreas toward the right gutter.  There were adhesions of the omentum to the anterior abdominal wall which I took down.  There were also adhesions of the omentum to the liver and gallbladder which were taken down.  I began with the cholecystectomy first. I mobilized omental adhesions off the gallbladder.  I  then grasped the infundibulum and retracted it laterally and using careful blunt and sharp dissection, identified the cystic duct and made a window around it.  I then identified anterior and posterior branches of the cystic artery, clipped it and divided it.  There was a fair amount of inflammatory tissue around this area.  I then took the gallbladder down retrograde from the fundus down until the only attachment was the cystic duct.  A small incision was made in the cystic duct and the cholangiocatheter was placed into the cystic duct and a cholangiogram performed.  Under real time fluoroscopy, dilute contrast material was injected into the cystic duct.  It promptly filled the common hepatic and right and left hepatic ducts as well as the common bile duct and splashed to the duodenum.  A small pancreatic duct was also noted.  I subsequently removed the cholangiocath, I tied the cystic duct approximately twice with 2-0 Vicryl sutures and then clipped it once and then removed the gallbladder from the field.  The gallbladder bed was then packed with a laparotomy pad.  Next, I approached the anterior wall of the stomach and made an anterior gastrostomy with the cautery.  I used silk stay sutures to expose the area.  I could feel the pseudocyst bulging up through the posterior wall of the stomach and I took an 18 gauge needle and aspirated cyst contents.  I then made approximately 6 to 7 cm incision through the posterior wall of the stomach into the pseudocyst and evacuated debris and fluid from the pseudocyst.  Using running 3-0 Vicryl sutures, I  then sewed the pseudocyst wall and reinforced it to the posterior aspect of the stomach with running locking 3-0 Vicryl sutures.  The cyst was already fairly adherent to the stomach.  This area was hemostatic.  A nasogastric tube was then placed proximal to this area.  The anterior gastrotomy was closed in two layers first with a running  3-0 Vicryl suture and second with interrupted 3-0 silk suture in a Lembert type fashion.  Gloves were changed and the abdominal cavity irrigated.  The gallbladder fossa was re-examined.  No bleeding or bile leak was noted.  A piece of Surgicel was placed on the raw surface of the liver.  Sponge, needle and instrument counts were then reported to be correct.  I approached the umbilicus and saw that he had a small umbilical hernia and reduced most of the contents and then closed it primarily with 0 Novofil suture.  I then closed the midline fascia with a running #1 PDS suture.  The subcutaneous tissue was irrigated and the skin was closed with staples.  He tolerated the procedure well without any apparently complications and was taken to the recovery room in satisfactory condition.  The frozen section of the cyst wall was benign inflammatory tissue. Dictated by:   Adolph Pollack, M.D. Attending Physician:  Arlis Porta DD:  09/12/01 TD:  09/14/01 Job: [id number]

## 2010-07-25 NOTE — Consult Note (Signed)
[name]. [facility]  Patient:    [name] [name], [name] Visit Number: [id number] MRN: [id number]          Service Type: MED Location: [phone] Attending Physician:  McDiarmid, Leighton Roach. Dictated by:   Florencia Reasons, M.D. Admit Date:  05/04/2001   CC:         Maye Hides, M.D.  [facility]   Consultation Report  REASON FOR CONSULTATION:  The Family Practice Teaching Service asked [name] [name] to see this 77 year old gentleman because of acute pancreatitis.  HISTORY OF PRESENT ILLNESS:  [name] [name] was admitted to the hospital a couple of days ago with severe abdominal pain and was found to have biochemical and radiographic findings compatible with acute pancreatitis despite the absence of any previous history of pancreatitis.  Although there was a remote history of excessive ethanol use, he quit two years ago except for a nip of "rock and rye" which is basically cough syrup he would take each evening, probably somewhat more recently as much as a couple of pints over the past couple of months, none for the past two years.  Since admission, the patient has spiked a low grade fever, has had a high white count, has had some hypoxia, although he does have some degree of dyspnea at baseline from previous smoking and working in the [employer].  His CT scan raised a question of nonenhancement of the head of the pancreas raising a question of a possible pancreatic necrosis.  Prodromally, the patient does not have any history of anorexia or chronic abdominal pain to suggest antecedent pancreatic cancer.  ALLERGIES:  No known allergies.  MEDICATIONS:  Lotrol, Xanax, vitamins.  PAST SURGICAL HISTORY:  None.  Never previously admitted to the hospital.  PAST MEDICAL HISTORY:  COPD.  History of childhood asthma.  Elevated cholesterol.  Apparently, elevated blood pressure.  No known coronary disease. No diabetes.  HABITS:  The patient stopped smoking  about 10 years ago and stopped drinking significant alcohol two years ago but there is some recent exposure as per HPI.  FAMILY HISTORY:  The patient has no family history of gastrointestinal illnesses such as gallstones, pancreatitis, colon cancer, ulcers.  SOCIAL HISTORY:  The patient is married and lives with his wife who is in the hospital room at this time.  He previously worked in the [employer].  REVIEW OF SYSTEMS:  Constipation.  No rectal bleeding.  No anorexia.  The constipation may be related to some cough medicine he has been using.  Chronic mild to moderate dyspnea on exertion per HPI.  PHYSICAL EXAMINATION:  GENERAL:  At the time I came in, the patient was resting quietly in bed sitting upright.  He really in absolutely no distress.  HEENT:  He is anicteric and without overt pallor.  CHEST:  Clear.  HEART:  Normal.  ABDOMEN:  Quiet with hypoactive bowel sounds.  Somewhat rotund and firm but not frankly rigid and without overt tenderness.  He had had a pain shot fairly recently.  No peritoneal findings.  No impressive tenderness at this time.  RECTAL:  Heme negative stool on admission and was not repeated.  LABORATORY DATA:  White count 28,900, hemoglobin 15.3 consistent with hemoconcentration.  SGOT 90, SGPT 81, alkaline phosphatase minimally elevated at 127.  Amylase was 2200 on admission and is now 1200.  Lipase was 5900 on admission.  Cholesterol slightly elevated at 228, triglycerides 175.  CT scan:  I reviewed the patients  abdominal CT scan.  I do not see any pancreatic calcifications.  The radiologist points out that the head of the pancreas does not enhance the same way the tail of the pancreas does, raising at least a question of pancreatic necrosis.  IMPRESSION:  Overall, the patient clinically looks substantially better than his numbers or his radiographic appearance would suggest.  PLAN:  Continued supportive care.  With the high white count and  the low grade fever early, approximately 100.7 degrees, I think that continued antibiotic therapy is prudent.  I would continue to follow labs and exam.  If he takes a turn for the worse, I would repeat the CT scan looking for evidence of infected pancreatic necrosis or a pancreatic abscess.  Eventually, he will need a follow-up CT to confirm healing and the absence of a pseudocyst.  I advised the patient and his wife that he should never have alcohol again. Dictated by:   Florencia Reasons, M.D. Attending Physician:  McDiarmid, Tawanna Cooler D. DD:  05/07/01 TD:  05/07/01 Job: [id number]

## 2010-07-25 NOTE — Cardiovascular Report (Signed)
NAME:  [name], [name]                ACCOUNT NO.:  [id number]   MEDICAL RECORD NO.:  [id number]          PATIENT TYPE:  OIB   LOCATION:  1961                         FACILITY:  [facility]   PHYSICIAN:  Vesta Mixer, M.D. DATE OF BIRTH:  [birth year]   DATE OF PROCEDURE:  07/17/2005  DATE OF DISCHARGE:                              CARDIAC CATHETERIZATION   [name] is a 77 year old gentleman with a history of hypertension,  hypertriglyceridemia and depression.  He is admitted now for heart  catheterization for further evaluation of some chest pain.   PROCEDURE:  Left heart catheterization with coronary angiography.  The right  femoral artery was easily cannulated using the modified Seldinger technique.   HEMODYNAMICS:  LV pressure is 160/16 with an aortic pressure of 165/83.   ANGIOGRAPHY:  Left main:  Left main is fairly smooth and normal.   The left anterior descending artery is fairly smooth normal.  There are  several diagonal arteries which are normal.   Left circumflex artery is a moderate size vessel.  It is fairly normal in  the proximal segment.  It gives off a very large first obtuse marginal  artery.  There is a 20 to 30% stenosis in the mid aspect of this first OM.   The circumflex continues around and gives off several small and  posterolateral branches.  All of these branches are small but fairly normal.   Right coronary artery is small to moderate size.  It is dominant.  It gives  off a small posterior descending artery which is normal and a small  posterolateral branch which is normal.  The entire body of the right  coronary artery is essentially normal.   The left ventriculogram was performed in the 30 RAO position.  It reveals  overall normal left ventricular systolic function.  There is mild to  moderate mitral regurgitation.   A distal aortogram was performed because of some difficulty in getting up  into the proximal aorta.  It reveals tortuosity of the aorta.   There is some  plaque and irregularities of the right iliac system.  The left iliac system  has significant ulcerative disease. It does not appear to be flow  obstructive, but he does have 70 to 80% stenosis in that region.  There is  no evidence of aortic aneurysm, although the angiogram was performed below  the level of the renal arteries.   TOTAL CONTRAST USED:  65 cc.   COMPLICATIONS:  None.   CONCLUSION:  1.  Minor coronary artery irregularities.  2.  Significant peripheral vascular disease.  He has the most severe disease      in his left iliac artery.  It does not      appear to be flow obstructive, but he certainly would require some      aggressive cholesterol lowering and medical therapy.  3.  Normal left ventricular systolic function with mild moderate mitral      regurgitation.           ______________________________  Vesta Mixer, M.D.     PJN/MEDQ  D:  07/17/2005  T:  07/18/2005  Job:  [id number]   cc:   Ernestina Penna, M.D.  Fax: [phone]

## 2010-07-25 NOTE — H&P (Signed)
[place]. [facility]  Patient:    [name] [name] Visit Number: [id number] MRN: [id number]          Service Type: SUR Location: [phone] Attending Physician:  Arlis Porta Dictated by:   Adolph Pollack, M.D. Admit Date:  09/12/2001                           History and Physical  REASON FOR ADMISSION:  Elective internal drainage of pancreatic pseudocyst and cholecystectomy.  HISTORY OF PRESENT ILLNESS:  The patient is a 77 year old male who was admitted into the hospital in March with severe pancreatitis.  He had a history in the past of heavy alcohol use but was not using much of that at the time.  He was discovered to have gallstones.  He improved with medical management but had to have some exocrine replacement.  He was noted to have peripancreatic fluid collections and then he developed a pseudocyst.  The pseudocyst started out small but this began enlarging and causing him to have symptomatic epigastric pain, also has mild hyperlipasemia at times.  Because of his symptomatic symptoms from his pseudocyst, he now presents for the above elective operation.  PAST MEDICAL HISTORY: 1. Pneumonia. 2. Alcohol abuse. 3. Hypertension. 4. Pancreatitis with pseudocyst.  PREVIOUS OPERATIONS:  Bilateral cataract extractions; tooth extraction.  ALLERGIES:  None.  MEDICATIONS: 1. Lotrel 5 to 10 mg q.d. 2. Xanax 0.25 mg p.r.n. 3. Pangestym two tablets with each meal. 4. Endocet one p.r.n. pain. 5. Drixoral p.r.n.  SOCIAL HISTORY:  He used to drink heavily but states he quit many years ago. He quit smoking about 12 years ago.  REVIEW OF SYSTEMS:  He reports having some shortness of breath with what is felt is COPD.  ENDOCRINE:  No diabetes.  PHYSICAL EXAMINATION:  GENERAL:  A thin male in no acute distress, pleasant and cooperative.  VITAL SIGNS:  Temperature is 97.3, pulse 84, blood pressure 135/63.  EYES:  Extraocular motions  intact.  No icterus.  NECK:  Supple without masses.  CARDIOVASCULAR:  Heart demonstrates a regular rate and rhythm.  RESPIRATORY:  Breath sounds equal and clear, respirations nonlabored.  ABDOMEN:  The abdomen is soft with epigastric fullness and tenderness present. There is a small umbilical hernia noted.  Active bowel sounds are noted.  EXTREMITIES:  No cyanosis or edema.  IMPRESSION: 1. History of pancreatitis with pseudocyst formation that is now becoming    symptomatic.  Pseudocyst is approximately three to four months old.  Also    report of cholelithiasis and it was felt this could be a biliary    pancreatitis episode. 2. History of hypertension.  PLAN:  To the operating room for internal drainage of the pancreatic pseudocyst by way of cystgastrostomy and cholecystectomy.  The procedure and the risks have been explained to him and his wife extensively preoperatively. Dictated by:   Adolph Pollack, M.D. Attending Physician:  Arlis Porta DD:  09/12/01 TD:  09/14/01 Job: [id number]

## 2010-07-25 NOTE — Op Note (Signed)
[place]. [facility]  Patient:    [name] [name], [name] [name] Visit Number: [id number] MRN: [id number]          Service Type: MED Location: [phone] Attending Physician:  Arlis Porta Proc. Date: 09/23/01 Admit Date:  09/22/2001   CC:         Florencia Reasons, M.D.  Monica Becton, M.D.   Operative Report  PREOPERATIVE DIAGNOSIS:  Upper gastrointestinal bleeding.  POSTOPERATIVE DIAGNOSIS:  Bleeding from pancreatic pseudocyst.  PROCEDURE PERFORMED:  Exploratory laparotomy and oversewing of bleeding vessel in posterior aspect of pancreatic pseudocyst.  SURGEON:  Adolph Pollack, M.D.  ASSISTANT:  Thornton Park. Daphine Deutscher, M.D.  ANESTHESIA:  General.  INDICATION:  [name] is a 77 year old male who underwent cyst gastrostomy for symptomatic enlarging pancreatic pseudocyst 09/12/01.  He was post discharge in approximately six days doing well until yesterday when he began feeling poorly and having hematemesis.  Upper endoscopy did not demonstrate an active source, although a suggestion may be coming from the posterior stomach where the cyst gastrostomy was.  He had stopped last night, but then restarted bleeding massively this morning and is brought to the operating room as an emergency.  FINDINGS:  The cyst gastrostomy was patent and intact.  In the posterior aspect of the cyst, there was a pulsatile vessel.  It appeared that the cyst had eroded into this vessel.  It was a medium size vessel.  DESCRIPTION OF PROCEDURE:  He was placed supine on the operating table and a general anesthetic was administered.  The abdomen was sterilely prepped and draped.  The previous midline incision was reopened.  Upon entering the abdominal cavity, the stomach was descended and I could see the anterior gastrotomy suture line which was opened and a large amount of blood and clot and blood clot were evacuated.  There was foul smelling material in this.  I went  ahead and setup for Grams stain and it came back growing multiple organisms as would be expected with an enteric cyst anastomosis.  I then packed off the stomach with laparotomy pads and packed the cyst as well.  When I removed the laparotomy pad from the stomach, no bleeding from the stomach proper was noted.  When I removed the laparotomy pad from the cyst, I could see a pulsatile vessel in the posterior wall of the cyst.  I was able to control this by oversewing it and applying some clips.  The cyst gastrostomy suture line was intact and was without bleeding.  I went ahead and irrigated this area out.  At this point, I noted no further bleeding from the sites.  I placed some Avitene over the area where I ligated the vessel.  I then had the NG tube passed and ensured it was proximal to the anterior gastrotomy.  I then closed the anterior gastrotomy with running 3-0 Vicryl suture for the first layer and interrupted 3-0 silk sutures in a Lembert type fashion for the second layer.  Next, we irrigated out the abdominal cavity and it had no further bleeding.  I then made sure the needle, sponge, and instrument counts were correct.  I closed the fascia with a running #1 PDS suture and put #2 nylon retention sutures in as well.  The subcutaneous tissue was irrigated and the skin was closed with staples, and then the retention sutures were tightened down over a rubber bridge.  He tolerated the procedure fairly well.  He received multiple units of  packed red blood cells and fresh frozen plasma.  A central line was subsequently placed by Dr. Sharee Holster in the right internal jugular vein.  He was then taken back to the intensive care unit intubated and in critical condition. Attending Physician:  Arlis Porta DD:  09/23/01 TD:  09/28/01 Job: [id number]

## 2010-07-25 NOTE — Discharge Summary (Signed)
   NAME:  [name] [name], [name] [name]                            ACCOUNT NO.:  [id number]   MEDICAL RECORD NO.:  [id number]                   PATIENT TYPE:  INP   LOCATION:  5733                                 FACILITY:  [facility]   PHYSICIAN:  Adolph Pollack, M.D.            DATE OF BIRTH:  Nov 02, 1929   DATE OF ADMISSION:  09/12/2001  DATE OF DISCHARGE:  09/18/2001                                 DISCHARGE SUMMARY   PRINCIPAL DISCHARGE DIAGNOSIS:  Symptomatic pancreatic pseudocyst.   SECONDARY DIAGNOSES:  1. Cholelithiasis.  2. Hypertension.   PROCEDURE:  Open cholecystectomy with intraoperative cholangiogram; cyst -  gastrostomy.  September 12, 2001.   REASON FOR ADMISSION:  The patient is a 77 year old male who was admitted to  the hospital March 2003 with severe pancreatitis.  He had had a history of  alcohol use in the past but this was felt to be biliary in nature as he was  discovered to have gallstones.  He developed a pancreatic pseudocyst which  was enlarging and became symptomatic.  Thus, he failed expectant management  and was admitted to the hospital for the above procedure.   HOSPITAL COURSE:  He underwent the above procedure without complications.  His hypertension was controlled with p.r.n. labetalol.  He had a fairly  unremarkable postoperative course.  The nasogastric tube was left in until  his fifth postoperative day when it was removed and he started on a liquid  diet which he tolerated well.  He began having bowel movements.  He was  ambulatory.  Wound looked clean and he wanted to go home and was discharged  on September 18, 2001.   DISPOSITION:  Discharged to home on September 18, 2001.   DISCHARGE INSTRUCTIONS:  Discharge instructions and a prescription for pain  medicine was given to him.  He knows to the call the office if he has any  problems and was to call and arrange for a followup appointment with me.                                                Adolph Pollack,  M.D.    [name]  D:  10/04/2001  T:  10/07/2001  Job:  [id number]   cc:   Florencia Reasons, M.D.   Elvina Sidle, M.D.

## 2010-07-25 NOTE — Discharge Summary (Signed)
[place]. [facility]  Patient:    [name] [name] Visit Number: [id number] MRN: [id number]          Service Type: MED Location: [phone] Attending Physician:  McDiarmid, Leighton Roach. Dictated by:   Mont Dutton, M.D. Admit Date:  05/04/2001 Discharge Date: 05/12/2001                             Discharge Summary  ADMITTING DIAGNOSES: 1. Pancreatitis. 2. Chronic obstructive pulmonary disease. 3. Hypertension. 4. Hyperglycemia.  DISCHARGE DIAGNOSES: 1. Acute pancreatitis. 2. Chronic obstructive pulmonary disease. 3. Hypertension.  ADMISSION HISTORY AND PHYSICAL:  The patient is a 77 year old male with a past medical history of hypertension, COPD, and anxiety, who presented to [facility] ED on May 04, 2001 secondary to a one-day history of worsening abdominal pain.  This pain was epigastric in nature, which radiated to periumbilical.  He also had nausea and vomiting.  On admission he was found to have a lipase of 5896 with an amylase of 2197.  Only alcohol use was [place] as a cough syrup until two weeks ago.  However, the patient does have a 30-year history of alcohol, hard liquor, but he has had no alcohol or tobacco for greater than five years, with the exception of the [place] he had for his cough.  On examination he was noted to have some decreased bowel sounds, tense, and tender to palpation in the epigastrium.  Palpable liver edge.  No apparent splenomegaly.  LABORATORY DATA:  White blood cell count 19.5, hemoglobin 15.6, hematocrit 46.4, platelets 260, 70 neutrophils, 23 lymphs, 6 monos.  BMP showed a sodium of 139, potassium 3.6, chloride 99, CO2 of 27, BUN 21, creatinine 1.7, glucose 226, calcium 9.5, lipase 5896, amylase 2197.  Chest x-ray showed moderate bibasilar atelectasis.  For remaining details of admission history and physical, please refer to the dictated history and physical by Dr. Rodman Pickle.  HOSPITAL  COURSE: #1 - Pancreatitis.  The patient was admitted to telemetry, placed n.p.o, and given maintenance IV fluids.  He had received a normal saline bolus in the emergency department.  Started Phenergan and morphine for pain, and Protonix. He was covered with Cipro and Flagyl for GI flora.  His pancreatitis was of an unknown etiology.  Differential included alcohol-induced, viral, medication, gallstone, or mass.  His abdominal examination was concerning as he was tense, he had guarding, and hypoactive bowel sounds, and diffusely tender.  Urine output was originally low, so increased IV fluids for hydration.  GI consult was placed for a possible ERCP since was without cause for the pancreatitis. On the third day of hospitalization, the patients pancreatitis appeared to be somewhat improving as lipase was decreased to 114 and amylase decreased to 461.  The etiology was felt most likely due to alcohol, since he was a heavy drinker in the past and had recently had [place] for treatment of his cough.  He was continued on Cipro and Flagyl.  The patient was advanced slowly on his diet as he was tolerating clears.  GI recommended to continue symptomatic treatment and followup CT prior to discharge.  His amylase and lipase had normalized.  He was tolerating clear liquids without problems.  Repeat CT was obtained to rule out pseudocyst. However, on the CT radiology believed there was a significant portion of the pancreas that did look necrotic.  Discussed this with the patient  and Dr. Laural Benes, who was covering for Dr. Matthias Hughs, who stated that if there was a necrotic area that it possibly needed to be aspirated, plus/minus antibiotics, plus/minus evacuation of the necrotic tissue.  Flagyl and Cipro were restarted.  Dr. Matthias Hughs of GI once again visited the patient, and he stated that he would favor a surgery consultation to try to determine how to proceed from that point, as the patient was  clinically ready to go home and stable, however, CT showing probable necrotic area.  Discussed results with Dr. Carolynne Edouard of surgery, who says that since this patient was stable clinically he would no intervene the pancreas at this time.  Therefore, on May 12, 2001 the patient was discharged to home.  He was continued on the Pancrease that was started in the hospital for his meals, as well as close followup with Dr. Matthias Hughs.  #2 - Chronic obstructive pulmonary disease.  The patient on admission stated he had emphysema but he takes no medicine.  However, he started Combivent in the hospital to continue as an outpatient.  The patient did have one episode of relative hypoxia.  O2 saturation dropped to 89% on room air.  However, he was asymptomatic at this time.  He was resting and appeared comfortable.  This was thought to be most likely secondary to restriction from his abdominal distention.  Continue the current treatment plan.  We did not want to increase oxygen too much as did not want to suppress his respiratory drive, as he did have a history of COPD.  He is continued on Combivent.  The respiratory difficulty he had during hospitalization was secondary to the diaphragmatic compression which was due to his abdominal distention.  His breathing improved and remained stable throughout the rest of hospitalization.  He was discharged to home on Combivent inhaler.  #3 - Hypertension.  The patient was on Lotrel at home.  This was held on admission since not sure if cause of pancreatitis.  Instead, started Norvasc 5 mg q.d. and monitored his blood pressures.  He was ruled out for MI as an epigastric pain, and he did have a few PVCs on his ECG, CK of 103, MB 2.0, tpn 0.02 on admission, an ECG with normal sinus rhythm with occasional PVC.  The patient was started on beta-blocker and calcium channel blocker to treat his hypertension as he was noted to have a blood pressure of 190/120 on the day after  admission.  He was on Norvasc and Toprol-XL was added.  From that point his blood pressure remained fairly stable on beta-blocker and calcium channel  blocker.  These two medications were continued.  As he only had a few episodes of isolated hypertension, which may have been due to his pain, the decision was made at the time of discharge to discharge the patient back on his home medication regimen with close followup with his primary doctor.  Of note, the patient was also noted to have a slightly low hemoglobin at one point during hospitalization.  His hemoglobin trended from 12.4 to 11.9, and he did have one heme-positive stool.  However, this was secondary to possible fissure or hemorrhoids.  He did not have any the day after.  His hemoglobin improved to 13.3 without intervention.  This is to be followed up as an outpatient.  The patient was followed both by GI and the mobility team while hospitalized.  The patient was stable for discharge on May 12, 2001.  DISCHARGE MEDICATIONS: 1. Pancrease two  tablets p.o. with meals. 2. Combivent inhaler two puffs by mouth q.i.d. 3. The patient was to restart his home medications.  ACTIVITY:  The patient is avoid all alcohol.  DIET:  Low-fat diet with small portions and more frequent portions.  WOUND CARE:  Not applicable.  SPECIAL INSTRUCTIONS:  The patient was noted that if his symptoms worsen or has vomiting, has a fever.  He is to call his doctor or return to the ER. Followup is scheduled for Dr. Elvina Sidle, the patients primary Calynn Ferrero, with Tomi Bamberger on Monday, May 16, 2001 at 10:00 a.m.  Also followup is scheduled with Dr. Florencia Reasons, of gastroenterology, on Friday, May 20, 2001 at 12:00 p.m.  The patient was provided a phone number for any questions.  DISPOSITION:  The patient was discharged to home on May 12, 2001 in stable condition. Dictated by:   Mont Dutton, M.D. Attending Physician:  McDiarmid,  Tawanna Cooler D. DD:  06/27/01 TD:  06/28/01 Job: [id number]

## 2010-07-25 NOTE — H&P (Signed)
[place]. [facility]  Patient:    [name] [name], [name] [name] Visit Number: [id number] MRN: [id number]          Service Type: MED Location: [phone] Attending Physician:  McDiarmid, Leighton Roach. Dictated by:   Harrold Donath, M.D. Admit Date:  05/04/2001                           History and Physical  CHIEF COMPLAINT:  Abdominal pain.  HISTORY OF PRESENT ILLNESS:  Patient is a 77 year old white male with abdominal pain since 1:30 this afternoon.  He had lunch and the pain came on after eating.  He describes it in the epigastric region and with radiation into the supraumbilical region.  There is no back radiation.  He has had one episode of nonbloody emesis, immediately before entering the emergency department.  He denies any diarrhea or constipation.  He has had a very mild episode of this pain in the past after eating spicy foods but never this severe.  He denies any history of pancreatitis.  REVIEW OF SYSTEMS:  No fevers, no headache, no chest pain, no URI symptoms, no shortness of breath, no dysuria.  He does complain of some hesitancy.  No musculoskeletal pain.  No melena or bright red blood per rectum.  PAST MEDICAL HISTORY:  Significant for hypertension, COPD and anxiety.  MEDICATIONS:  Lotrel, multivitamin and Xanax.  ALLERGIES:  No known drug allergies.  SOCIAL HISTORY:  The patient lives with his wife and has been married for quite a long time.  He has a history of tobacco use with a 50-pack-year history and stopped smoking in 1991.  He also has a history of alcohol abuse.  He denies any illicit drugs and he is a retired Scientist, product/process development of approximately 40 years.  FAMILY HISTORY:  His brother died of an MI in his 8s and his mother died of an MI in her 72s as well.  PHYSICAL EXAMINATION:  VITAL SIGNS:  Temperature 97.4, blood pressure 141/91, heart rate 86, respirations 22 to 32, saturating 96% on room air.  GENERAL:  He is alert, in mild  distress and pleasant.  HEENT:  Pupils are equal, round and reactive to light.  Extraocular muscles are intact.  Tympanic membranes are clear bilaterally.  Oropharynx was clear without erythema or exudate.  He has fair dentition.  NECK:  No lymphadenopathy.  No thyromegaly.  No JVD.  LUNGS:  Clear to auscultation bilaterally except for crackles in the left lower lobe.  CARDIOVASCULAR:  Regular rate and rhythm.  There is a questionable 1-2/6 systolic ejection murmur at the right sternal border.  He has 2+ dorsalis pedis pulses bilaterally.  ABDOMEN:  Decreased breath sounds, somewhat tense, and there is tenderness to palpation in the epigastrium.  No discoloration.  He has a palpable liver edge approximately 2 cm below the costal margin.  No splenomegaly.  EXTREMITIES:  No clubbing, cyanosis, or edema.  They are warm to touch.  RECTAL:  Guaiac negative.  No masses.  NEUROLOGIC:  Cranial nerves II-XII are intact.  His strength was appropriate for age and symmetric and his sensation was intact.  LABORATORY AND ACCESSORY DATA:  CBC:  White blood cell 19.5, H&H 15.6 and 46.4, platelet count of 260,000.  A differential on that was 70% segs, 23% lymphocytes and 6% monocytes.  CMP:  Sodium 139, potassium 3.6, chloride 99, bicarb 27, BUN of 21, creatinine 1.7, glucose 226, calcium  9.5, alkaline phosphatase 121, albumin 3.7, AST 105, ALP 55 and bilirubin of 1.6.  His lipase is 5896.  His amylase is 2197.  His CK is 103, MB 2.0 and troponin 0.02.  His PT is 12.9 with an INR of 1.0.  His PTT is 24.  His chest x-ray showed moderate bibasilar atelectasis.  EKG showed normal sinus rhythm with occasional PVCs.  His right upper quadrant ultrasound is pending.  ASSESSMENT AND PLAN:  This is a 77 year old with abdominal pain consistent with acute pancreatitis. 1. Pancreatitis:  Will admit and keep n.p.o. and give maintenance fluids.  He    already received a normal saline bolus in the  emergency department.  Keep    strict intakes and outputs.  Will start Phenergan, morphine for pain and    Protonix.  Cover with Cipro and Flagyl for gastrointestinal flora.  Will    discuss this with team.  Check fasting lipid profile and await the    ultrasound results.  Will likely get an abdominal CT with contrast as well.    Differential for pancreatitis includes gallstones, Lotrel also has adverse    side-effects consistent with pancreatitis, alcohol and    hypertriglyceridemia. 2. Chronic obstructive pulmonary disease:  Patient states he takes no    medications for his emphysema but will start Combivent while here and    continue as outpatient.  Consider albuterol nebulizers if necessary. 3. Hypertension:  Patient is on Lotrel at home and he is unsure of the dose.    Will hold since this has a risk of causing pancreatitis.  Since Lotrel    contains amlodipine, will start Norvasc 5 mg q.d. and monitor his blood    pressure.  This could have been an atypical picture for myocardial    infarction but with the first set of enzymes being normal and    electrocardiogram shows no acute changes, will not rule him out. 4. Elevated transaminases:  This may be secondary to gallstones, which would    fit with the acute pancreatitis picture.  Will check a hepatitis panel and    await the ultrasound results. 5. Hyperglycemia:  This may also be secondary to pancreatitis but would expect    this in a setting of acute-on-chronic pancreatitis.  Will follow him basic    metabolic panels and he may have underlying diabetes that has been    undiagnosed. 6. Infectious disease:  Patient has increased white blood cell count.  This is    lightly secondary to #1, however, will check a urinalysis with urine    culture and follow daily complete blood counts. Dictated by:   Harrold Donath, M.D. Attending Physician:  McDiarmid, Tawanna Cooler D. DD:  05/04/01 TD:  05/05/01 Job: [id number]

## 2010-07-25 NOTE — H&P (Signed)
NAME:  [name], [name]                ACCOUNT NO.:  [id number]   MEDICAL RECORD NO.:  [id number]           PATIENT TYPE:   LOCATION:                               FACILITY:  [facility]   PHYSICIAN:  Vesta Mixer, M.D. DATE OF BIRTH:  08-21-1929   DATE OF ADMISSION:  07/15/2005  DATE OF DISCHARGE:                                HISTORY & PHYSICAL   HISTORY OF PRESENT ILLNESS:  [name] [name] is a 77 year old gentleman with a  history of hypertension, hypertriglyceridemia and depression.  He is now  admitted to the hospital for heart catheterization for episodes of unstable  angina.   [name] has had several risk factors for coronary artery disease including  hypertension and hypertriglyceridemia for many years.  He started having  some episodes of chest tightness about a year ago.  He thought at that time  it was fairly minimal and he pretty much ignored it.  Over the past several  months this pain has been gradually worsening.  He notes that he has severe  tightness across his chest with any sort of exertion.  The tightness feels  like a band-like squeezing sensation.  It is associated with shortness of  breath.  After he stops walking it usually resolves in 10-20 minutes.  He  has never had any episodes of syncope.  It does not radiate out his arm.  He  denies any diaphoresis.  He states that his work has now been increasingly  limited because of these episodes of chest discomfort and shortness of  breath.  He received some Advair from Dr. Kathi Der office recently and he  states that it has helped a little bit.   CURRENT MEDICATIONS:  Current medications are metoprolol 25 mg a day,  paroxetine 30 mg a day, TriCor 145 mg a day, Singulair 10 mg a day and  Pangestyme three tablets a day.   ALLERGIES:  HE HAS NO KNOWN DRUG ALLERGIES.   PAST MEDICAL HISTORY:  1.  Hypertension.  2.  Depression.  3.  Hypertriglyceridemia.   PAST SURGICAL HISTORY:  Status post pancreatic cyst removal in  2003, he has  had bilateral cataract surgery.   SOCIAL HISTORY:  The patient is retired from [employer].  He quit  smoking in 1990.  He quit drinking in 1983.   FAMILY HISTORY:  His father died at age 34 due to a fall.  His mother died  at age 38 due to coronary artery disease and a myocardial infarction.  She  also had a CVA.  He has a brother that died at age 30 due to a myocardial  infarction and another sister who died at age 59 due to jaundice.   REVIEW OF SYSTEMS:  His review of systems was reviewed.  He denies any  problems with his eyes, ears, nose and throat.  He denies any heat or cold  tolerance, weight gain or weight loss.  He denies any cough or sputum  production.  He denies any easy bruisability or bleeding in his stool.  Otherwise his review of systems was reviewed  and is essentially negative.   PHYSICAL EXAMINATION:  GENERAL:  On exam he is an elderly gentleman in no  acute distress stress.  He is alert and oriented x3 and his mood and affect  are normal.  VITALS:  His weight is 158.  His blood pressure is 130/80 with heart rate of  56.  HEAD AND NECK:  His HEENT exam reveals 2+ carotids, he has no bruits, there  is no JVD and no thyromegaly.  LUNGS:  Clear to auscultation.  HEART:  Regular rate S1-S2 with no murmurs.  ABDOMEN:  His abdominal exam reveals good bowel sounds and is nontender.  EXTREMITIES:  He has no clubbing, cyanosis or edema.  NEURO EXAM:  Nonfocal.   LABORATORY DATA:  His creatinine is 1.7, BUN is 22, sodium is 140, potassium  is 4.4, CO2 is 30, glucose is 95, hemoglobin is 13.9, white blood cell count  is 6.1, hematocrit is 43.5.  His prothrombin time is normal at 13.3.   His EKG reveals normal sinus rhythm/sinus bradycardia.  He has no ST or T-  wave changes.   IMPRESSION AND PLAN:  [name] presents with symptoms that are very  consistent with unstable angina.  He now has chest pains and shortness of  breath with any sort of  little bit of exertion.  He states that Singulair  has helped him but it seems unlikely that this sudden worsening in his  symptoms is due to chronic obstructive pulmonary disease.  He has not smoked  in the past 16 years.  I have scheduled him for a heart catheterization.  We  have discussed the risks, benefits and options of heart catheterization.  He  understands and agrees to proceed.           ______________________________  Vesta Mixer, M.D.     PJN/MEDQ  D:  07/13/2005  T:  07/13/2005  Job:  [id number]   cc:   Ernestina Penna, M.D.  Fax: [phone]

## 2010-07-25 NOTE — Discharge Summary (Signed)
NAME:  [name] [name], [name] [name]                            ACCOUNT NO.:  [id number]   MEDICAL RECORD NO.:  [id number]                   PATIENT TYPE:  INP   LOCATION:  5709                                 FACILITY:  [facility]   PHYSICIAN:  Adolph Pollack, M.D.            DATE OF BIRTH:  Jul 14, 1929   DATE OF ADMISSION:  09/22/2001  DATE OF DISCHARGE:  10/03/2001                                 DISCHARGE SUMMARY   PRINCIPAL DISCHARGE DIAGNOSIS:  Bleeding pancreatic pseudocyst.   SECONDARY DIAGNOSES:  1. Hypertension.  2. Pancreatitis.   PROCEDURE PERFORMED:  Exploratory laparotomy, oversewing of bleeding blood  vessel in the pseudocyst, September 23, 2001.   REASON FOR ADMISSION:  The patient is a 77 year old male who underwent a  cyst gastrostomy for a symptomatic, enlarging pancreatic pseudocyst. He had  an unremarkable postoperative course and was discharged to home. He then  developed nausea, vomiting and chills, and had some hematemesis.   HOSPITAL COURSE:  He presented to the emergency department September 22, 2001 and  was admitted. He was given a transfusion and a CT scan was performed which  demonstrated some air in the previous cyst area. No oral contrast was seen  in the cyst cavity. Upper endoscopy was performed later that evening by Dr.  Matthias Hughs and no significant after bleeding was noted. He was coagulopathic,  so fresh frozen plasma and packed red blood cells were given. He remained  stable until earlier in the morning when he began having more hematemesis  and bleeding. Hemoglobin dropped to 6.4. He again was transfused and Dr.  Matthias Hughs repeated an upper endoscopy and blood appeared to be coming from the  cyst gastrostomy suture line. He subsequently was taken to the operating  room where he was found to have erosion of the pseudocyst into a medium  sized blood vessel abutting the posterior wall of the pseudocyst. This was  oversewn.   Postoperatively, he was noted to have  thrombocytopenia and hypocalcemia. The  thrombocytopenia required transfusion and the hypocalcemia was replaced. His  hemoglobin had slowly drifted down to 7.3 and he was given another  transfusion. He subsequently was started on TNA and kept on bowel rest. He  actually progressed fairly well. He was passing some dark stool, but it was  because his GI tract was full of blood. He did develop a DVT in his left  adrenal jugular vein and it was decided not to anticoagulate given his  recent bleeding event. A clear liquid diet was started on the 7th  postoperative day and he slowly advanced along as the diet was increased. By  the 10th postoperative day, he had made very good progress. Hemoglobin was  9.6 and stable, white blood cell count was normal. He was moving his bowels,  tolerating his diet, ambulatory, the wound looked good and he was  discharged.    DISPOSITION:  Discharged home in satisfactory  condition on October 03, 2001. He  will continue all of his activity restrictions as before. He was given  Darvocet for pain and told to take Protonix once a day as well as his home  medicines. He was told to take a vitamin with iron and avoid aspirin or  Advil. He will come back and see me in two weeks for followup appointment.                                                Adolph Pollack, M.D.    [name]  D:  11/09/2001  T:  11/10/2001  Job:  [id number]   cc:   Florencia Reasons, M.D.  [address] [place]  [place], [place] [address]  Fax: [phone]

## 2010-09-22 ENCOUNTER — Ambulatory Visit (INDEPENDENT_AMBULATORY_CARE_PROVIDER_SITE_OTHER): Payer: Medicare PPO | Admitting: Surgery

## 2010-09-22 ENCOUNTER — Other Ambulatory Visit: Payer: Self-pay | Admitting: Surgery

## 2010-09-22 ENCOUNTER — Encounter (INDEPENDENT_AMBULATORY_CARE_PROVIDER_SITE_OTHER): Payer: Medicare PPO

## 2010-09-22 DIAGNOSIS — I714 Abdominal aortic aneurysm, without rupture: Secondary | ICD-10-CM

## 2010-09-22 DIAGNOSIS — I711 Thoracic aortic aneurysm, ruptured: Secondary | ICD-10-CM

## 2010-09-23 NOTE — Assessment & Plan Note (Signed)
OFFICE VISIT  Ballow, Joshua A DOB:  Aug 03, 1929                                       09/22/2010 [id number]#:[id number]  REASON FOR VISIT:  Follow up aneurysm.  HISTORY:  This is a 77 year old gentleman that I have been following for infrarenal abdominal aortic aneurysm.  His last CT scan was in 2009.  At that time his aneurysm measured 4.1 cm.  His most recent ultrasound was a year and a half ago which showed an aneurysm of 4.7.  He is back today for followup.  He has no complaints at this time.  Specifically no abdominal pain or back pain.  PHYSICAL EXAM:  Vital signs:  His heart rate is 65, blood pressure 153/93, respiratory rate 12.  General:  He is well-appearing, in no distress.  Respirations:  Are nonlabored.  Abdomen:  Soft, nontender.  DIAGNOSTIC STUDIES:  Ultrasound today shows maximum diameter of his aneurysm is 5.0.  This is an increase from 4.7.  ASSESSMENT:  Asymptomatic infrarenal abdominal aortic aneurysm.  PLAN:  Based on the change in size, now being 5 cm, I think we need to proceed with a CT angiogram.  I have scheduled this for 2 weeks.  He will come back to see me on July 30.  At that time we will also get carotid duplex and baseline ABIs.  Of note, the patient does have a history of portal vein thrombosis on previous scans so we will need to evaluate this at the time of his repair.    Jorge Ny, MD Electronically Signed  VWB/MEDQ  D:  09/22/2010  T:  09/23/2010  Job:  [id number]  cc:   Teena Irani. Arlyce Dice, M.D.

## 2010-09-30 NOTE — Procedures (Unsigned)
DUPLEX ULTRASOUND OF ABDOMINAL AORTA  INDICATION:  Abdominal aortic aneurysm.  HISTORY: Diabetes:  No. Cardiac:  No. Hypertension:  Yes. Smoking:  No. Connective Tissue Disorder: Family History:  No. Previous Surgery:  Patient states he had a cyst removed from his pancreas.  DUPLEX EXAM:         AP (cm)                   TRANSVERSE (cm) Proximal             3.7 cm                    3.5 cm Mid                  3.6 cm                    3.8 cm Distal               5.0 cm                    5.0 cm Right Iliac          Not visualized            Not visualized Left Iliac           Not visualized            Not visualized  PREVIOUS:  Date: 03/24/2010  AP:  4.54  TRANSVERSE:  4.73  IMPRESSION: 1. Aneurysmal dilatation of the abdominal aorta with no significant     increase in maximal diameter when compared to the previous     examination. 2. Unable to adequately visualize bilateral common iliac arteries due     to overlying bowel gas patterns.  ___________________________________________ V. Charlena Cross, MD  CH/MEDQ  D:  09/24/2010  T:  09/25/2010  Job:  [id number]

## 2010-10-06 ENCOUNTER — Other Ambulatory Visit (INDEPENDENT_AMBULATORY_CARE_PROVIDER_SITE_OTHER): Payer: Medicare PPO

## 2010-10-06 ENCOUNTER — Ambulatory Visit (INDEPENDENT_AMBULATORY_CARE_PROVIDER_SITE_OTHER): Payer: Medicare PPO | Admitting: Surgery

## 2010-10-06 ENCOUNTER — Ambulatory Visit
Admission: RE | Admit: 2010-10-06 | Discharge: 2010-10-06 | Disposition: A | Discharge status: Home / Self Care | Payer: Medicare PPO | Source: Ambulatory Visit | Attending: Surgery | Admitting: Surgery

## 2010-10-06 ENCOUNTER — Other Ambulatory Visit: Payer: Self-pay | Admitting: Surgery

## 2010-10-06 ENCOUNTER — Encounter (INDEPENDENT_AMBULATORY_CARE_PROVIDER_SITE_OTHER): Payer: Medicare PPO

## 2010-10-06 DIAGNOSIS — I711 Thoracic aortic aneurysm, ruptured: Secondary | ICD-10-CM

## 2010-10-06 DIAGNOSIS — I714 Abdominal aortic aneurysm, without rupture: Secondary | ICD-10-CM

## 2010-10-06 DIAGNOSIS — I739 Peripheral vascular disease, unspecified: Secondary | ICD-10-CM

## 2010-10-06 DIAGNOSIS — I6529 Occlusion and stenosis of unspecified carotid artery: Secondary | ICD-10-CM

## 2010-10-07 NOTE — Assessment & Plan Note (Signed)
OFFICE VISIT  [name], [name] A DOB:  1929/07/04                                       10/06/2010 [id number]#:[id number]  The patient comes back today for followup of his abdominal aortic aneurysm.  He was scanned most recently in 2009.  At that time his aneurysm measured 4.1 cm.  His most recent ultrasound was a year and a half ago which showed it had progressed to 4.7 cm.  When I saw him in July ultrasound showed his aneurysm was now 5.0.  I sent him for a CT scan.  However, his creatinine was found to be 2.3 and therefore a noncontrasted scan was performed.  I have reviewed his CT scan.  He does not have an adequate infrarenal neck for straight forward endovascular repair.  His aneurysm measures about 5.1 in maximum diameter.  He does have aneurysmal changes to the distal descending thoracic aorta.  CT scan findings also show portal venous hypertension and suspected cirrhosis.  Based on the patient's overall medical comorbidities I think he is at high risk for open surgical repair.  I do not think he is a straight forward endovascular candidate given his inadequate infrarenal neck length.  I therefore feel like he would be best treated with a fenestrated or branch device.  I have explained this to the patient and have recommended sending him to [facility] to meet with Dr. Pattricia Boss to further discuss his options.  I am scheduling this appointment in approximately 3 months.  He will likely need additional imaging at that time.  He is due to see his nephrologist, Dr. Hyman Hopes, this week.  I did obtain a carotid duplex today which shows minimal carotid disease bilaterally with less than 39% stenosis bilaterally.  He has triphasic waveforms in his feet with ABIs of 1.0.    Jorge Ny, MD Electronically Signed  VWB/MEDQ  D:  10/06/2010  T:  10/07/2010  Job:  [id number]  cc:   Bennie Pierini, MD Garnetta Buddy, M.D. Teena Irani. Arlyce Dice, M.D.

## 2010-10-15 NOTE — Procedures (Unsigned)
CAROTID DUPLEX EXAM  INDICATION:  Carotid stenosis.  HISTORY: Diabetes:  No. Cardiac:  No. Hypertension:  Yes. Smoking:  No. Previous Surgery:  No. CV History:  Currently asymptomatic. Amaurosis Fugax No, Paresthesias No, Hemiparesis No                                      RIGHT             LEFT Brachial systolic pressure:         148               151 Brachial Doppler waveforms:         Normal            Normal Vertebral direction of flow:        Antegrade         Antegrade DUPLEX VELOCITIES (cm/sec) CCA peak systolic                   59                52 ECA peak systolic                   79                42 ICA peak systolic                   57                50 ICA end diastolic                   18                14 PLAQUE MORPHOLOGY:                  Mixed             Calcific PLAQUE AMOUNT:                      Minimal           Minimal PLAQUE LOCATION:                    Bifurcation       Bifurcation  IMPRESSION: 1. Right internal carotid artery velocity suggests 1%-39% stenosis. 2. Left internal carotid artery velocity suggests 1%-39% stenosis. 3. Antegrade vertebral arteries bilaterally.  ___________________________________________ V. Charlena Cross, MD  EM/MEDQ  D:  10/06/2010  T:  10/06/2010  Job:  [id number]

## 2012-06-06 ENCOUNTER — Telehealth: Payer: Self-pay | Admitting: Physician Assistant

## 2012-06-06 MED ORDER — SODIUM BICARBONATE 648 MG PO TABS: 648.0000 mg | ORAL_TABLET | Freq: Two times a day (BID) | ORAL | Status: DC

## 2012-06-06 NOTE — Telephone Encounter (Signed)
Medication refilled per protocol. 

## 2012-07-05 ENCOUNTER — Ambulatory Visit (INDEPENDENT_AMBULATORY_CARE_PROVIDER_SITE_OTHER): Payer: Medicare Other | Admitting: Family Medicine

## 2012-07-05 ENCOUNTER — Telehealth: Payer: Self-pay | Admitting: Family Medicine

## 2012-07-05 ENCOUNTER — Encounter: Payer: Self-pay | Admitting: Family Medicine

## 2012-07-05 ENCOUNTER — Other Ambulatory Visit: Payer: Self-pay | Admitting: Family Medicine

## 2012-07-05 VITALS — BP 140/82 | HR 66 | Temp 98.4°F | Resp 16 | Wt 145.0 lb

## 2012-07-05 DIAGNOSIS — I129 Hypertensive chronic kidney disease with stage 1 through stage 4 chronic kidney disease, or unspecified chronic kidney disease: Secondary | ICD-10-CM | POA: Insufficient documentation

## 2012-07-05 DIAGNOSIS — I714 Abdominal aortic aneurysm, without rupture, unspecified: Secondary | ICD-10-CM | POA: Insufficient documentation

## 2012-07-05 DIAGNOSIS — E785 Hyperlipidemia, unspecified: Secondary | ICD-10-CM | POA: Insufficient documentation

## 2012-07-05 DIAGNOSIS — R61 Generalized hyperhidrosis: Secondary | ICD-10-CM

## 2012-07-05 DIAGNOSIS — R5381 Other malaise: Secondary | ICD-10-CM

## 2012-07-05 DIAGNOSIS — R5383 Other fatigue: Secondary | ICD-10-CM

## 2012-07-05 DIAGNOSIS — I10 Essential (primary) hypertension: Secondary | ICD-10-CM | POA: Insufficient documentation

## 2012-07-05 LAB — CBC WITH DIFFERENTIAL/PLATELET
Eosinophils Relative: 3 % (ref 0–5)
HCT: 43.1 % (ref 39.0–52.0)
Hemoglobin: 14.7 g/dL (ref 13.0–17.0)
Lymphocytes Relative: 18 % (ref 12–46)
Lymphs Abs: 1.2 10*3/uL (ref 0.7–4.0)
MCV: 89.8 fL (ref 78.0–100.0)
Monocytes Absolute: 0.5 10*3/uL (ref 0.1–1.0)
Monocytes Relative: 7 % (ref 3–12)
Platelets: 115 10*3/uL — ABNORMAL LOW (ref 150–400)
RBC: 4.8 MIL/uL (ref 4.22–5.81)
WBC: 6.3 10*3/uL (ref 4.0–10.5)

## 2012-07-05 MED ORDER — METOPROLOL SUCCINATE ER 25 MG PO TB24: 25.0000 mg | ORAL_TABLET | Freq: Every day | ORAL | Status: DC

## 2012-07-05 NOTE — Progress Notes (Signed)
Subjective:    Patient ID: [name] [name], male    DOB: 01/13/1930, 79 y.o.   MRN: [id number]  HPI Patient is here today for followup. He is a known history of a AAA. This is followed at [facility]. Per the patient's report they did not want to operate to repair this due to the likely damage to his kidneys with the surgery based on its location. The patient states he would need dialysis if he underwent surgical repair. Therefore even though the AAA is greater than 5 cm, the plan is to monitor it closely.  He denies any abdominal pain. However his blood pressure today is elevated at 140/82. He is probably taking Norvasc 10 mg by mouth daily. He has a past history significant for chronic kidney disease stage IV. His nephrologist has recommended he avoid nephrotoxic medicines.  He also has hyperlipidemia. He is currently taking pravastatin 40 mg by mouth daily for this. He is due for a fasting lipid panel. He denies any myalgias or right upper quadrant pain.  The patient does report frequent night sweats. He denies any easy bruising. He denies any weight loss. He is not sure he's having fevers. He has no symptoms of an infection. He is up-to-date on his colonoscopy. However his prostate cancer screening is overdue Past Medical History  Diagnosis Date  . AAA (abdominal aortic aneurysm)   . Benign hypertension with CKD (chronic kidney disease) stage IV   . HTN (hypertension)   . Hyperlipidemia    Current outpatient prescriptions:amLODipine (NORVASC) 10 MG tablet, Take 10 mg by mouth daily., Disp: , Rfl: ;  CALCITRIOL PO, Take 0.25 mg by mouth daily., Disp: , Rfl: ;  Calcium Acetate 667 MG TABS, Take 667 mg by mouth 3 (three) times daily., Disp: , Rfl: ;  Cholecalciferol (VITAMIN D) 2000 UNITS CAPS, Take 2,000 Units by mouth daily., Disp: , Rfl:  Omega-3 Fatty Acids (FISH OIL) 1000 MG CAPS, Take 1 capsule by mouth 3 (three) times daily., Disp: , Rfl: ;  pravastatin (PRAVACHOL) 40 MG tablet, Take 40 mg by mouth  daily., Disp: , Rfl: ;  sertraline (ZOLOFT) 50 MG tablet, Take 50 mg by mouth 3 (three) times daily., Disp: , Rfl: ;  sodium bicarbonate 648 MG tablet, Take 1 tablet (648 mg total) by mouth 2 (two) times daily. 2 tabs by mouth twice daily, Disp: 120 tablet, Rfl: 4 metoprolol succinate (TOPROL-XL) 25 MG 24 hr tablet, Take 1 tablet (25 mg total) by mouth daily., Disp: 30 tablet, Rfl: 5  No Known Allergies History   Social History  . Marital Status: Married    Spouse Name: N/A    Number of Children: N/A  . Years of Education: N/A   Occupational History  . Not on file.   Social History Main Topics  . Smoking status: Former Smoker    Types: Cigarettes  . Smokeless tobacco: Former Neurosurgeon    Quit date: 03/09/1988  . Alcohol Use: No  . Drug Use: No  . Sexually Active: Not on file   Other Topics Concern  . Not on file   Social History Narrative  . No narrative on file       Review of Systems  All other systems reviewed and are negative.       Objective:   Physical Exam  Constitutional: He is oriented to person, place, and time.  HENT:  Head: Normocephalic.  Right Ear: External ear normal.  Left Ear: External ear normal.  Nose: Nose  normal.  Mouth/Throat: Oropharynx is clear and moist.  Eyes: Conjunctivae are normal.  Neck: Normal range of motion. Neck supple. No JVD present. No thyromegaly present.  Cardiovascular: Normal rate, regular rhythm and normal heart sounds.  Exam reveals no gallop and no friction rub.   No murmur heard. Pulmonary/Chest: Effort normal and breath sounds normal. No respiratory distress. He has no wheezes. He has no rales. He exhibits no tenderness.  Abdominal: Soft. Bowel sounds are normal. He exhibits no distension and no mass. There is no tenderness. There is no rebound and no guarding.  Lymphadenopathy:    He has no cervical adenopathy.  Neurological: He is alert and oriented to person, place, and time. He exhibits normal muscle tone.  Coordination normal.  Skin: Skin is warm. No rash noted. No erythema.          Assessment & Plan:  HTN (hypertension) - Plan: metoprolol succinate (TOPROL-XL) 25 MG 24 hr tablet, Basic Metabolic Panel  Night sweats - Plan: CBC with Differential, PSA, Medicare  HLD (hyperlipidemia) - Plan: Basic Metabolic Panel, Lipid Panel  AAA (abdominal aortic aneurysm) without rupture  Other malaise and fatigue - Plan: CBC with Differential, TSH, Testosterone  Due to his history of a AAA, I would like to keep his blood pressure closer to 120/80. Therefore I will begin Toprol XL 25 mg by mouth daily. We are to recheck blood pressure in one month. I will check a CMP and a fasting lipid panel to evaluate his LDL. His goal is less than 100. He is to follow up at [facility] as planned for history place screening. Is night sweats and fatigue, I will check a CBC, PSA. I have asked the patient to record his temperature during these night sweats.  He is having fevers, we will need to workup fever of unknown origin to exclude infections and malignancy. I also check a testosterone and a TSH to rule out hormonal causes of night sweats.

## 2012-07-06 LAB — BASIC METABOLIC PANEL
Chloride: 101 mEq/L (ref 96–112)
Potassium: 5.1 mEq/L (ref 3.5–5.3)
Sodium: 142 mEq/L (ref 135–145)

## 2012-07-06 LAB — LIPID PANEL
HDL: 40 mg/dL (ref >39)
LDL Cholesterol: 96 mg/dL (ref 0–99)
Total CHOL/HDL Ratio: 4.8 Ratio
Triglycerides: 284 mg/dL — ABNORMAL HIGH (ref <150)

## 2012-07-06 NOTE — Telephone Encounter (Signed)
I have faxed for records and they are going to fax the records back today.

## 2012-07-06 NOTE — Telephone Encounter (Signed)
Can we call and get records, non urgent

## 2012-07-11 ENCOUNTER — Telehealth: Payer: Self-pay | Admitting: Family Medicine

## 2012-07-11 NOTE — Telephone Encounter (Signed)
Give for one week. If he continues to do well in one week try taking one half a pill a day.  If that also causes him to feel tired discontinue altogether.

## 2012-07-11 NOTE — Telephone Encounter (Signed)
Pt wife Kriste Basque states that his BP meds Metoprolol 25 mg is causing him to be weak and makes him feel bad, he stays constipated with it and sick on stomach.he had taken it for four days and each day he was feeling worse. He quit taking is and feels better. What do you want him to do now?

## 2012-07-12 NOTE — Telephone Encounter (Signed)
Patient aware.

## 2013-03-21 ENCOUNTER — Ambulatory Visit (INDEPENDENT_AMBULATORY_CARE_PROVIDER_SITE_OTHER): Payer: Medicare HMO | Admitting: Family Medicine

## 2013-03-21 ENCOUNTER — Encounter: Payer: Self-pay | Admitting: Family Medicine

## 2013-03-21 VITALS — BP 130/86 | HR 64 | Temp 96.9°F | Resp 12 | Ht 66.5 in | Wt 145.0 lb

## 2013-03-21 DIAGNOSIS — R918 Other nonspecific abnormal finding of lung field: Secondary | ICD-10-CM

## 2013-03-21 DIAGNOSIS — I10 Essential (primary) hypertension: Secondary | ICD-10-CM

## 2013-03-21 DIAGNOSIS — L259 Unspecified contact dermatitis, unspecified cause: Secondary | ICD-10-CM

## 2013-03-21 DIAGNOSIS — L300 Nummular dermatitis: Secondary | ICD-10-CM

## 2013-03-21 LAB — COMPLETE METABOLIC PANEL WITH GFR
ALBUMIN: 4.1 g/dL (ref 3.5–5.2)
ALT: <8 U/L (ref 0–53)
AST: 19 U/L (ref 0–37)
Alkaline Phosphatase: 69 U/L (ref 39–117)
BUN: 21 mg/dL (ref 6–23)
CHLORIDE: 100 meq/L (ref 96–112)
CO2: 29 mEq/L (ref 19–32)
Calcium: 9.5 mg/dL (ref 8.4–10.5)
Creat: 2.17 mg/dL — ABNORMAL HIGH (ref 0.50–1.35)
GFR, EST NON AFRICAN AMERICAN: 28 mL/min — AB
GFR, Est African American: 32 mL/min — ABNORMAL LOW
GLUCOSE: 94 mg/dL (ref 70–99)
Potassium: 4 mEq/L (ref 3.5–5.3)
Sodium: 141 mEq/L (ref 135–145)
Total Bilirubin: 0.8 mg/dL (ref 0.3–1.2)
Total Protein: 7.3 g/dL (ref 6.0–8.3)

## 2013-03-21 LAB — CBC WITH DIFFERENTIAL/PLATELET
BASOS ABS: 0 10*3/uL (ref 0.0–0.1)
BASOS PCT: 1 % (ref 0–1)
Eosinophils Absolute: 0.2 10*3/uL (ref 0.0–0.7)
Eosinophils Relative: 3 % (ref 0–5)
HCT: 45.7 % (ref 39.0–52.0)
Hemoglobin: 15.1 g/dL (ref 13.0–17.0)
Lymphocytes Relative: 18 % (ref 12–46)
Lymphs Abs: 1.1 10*3/uL (ref 0.7–4.0)
MCH: 30.5 pg (ref 26.0–34.0)
MCHC: 33 g/dL (ref 30.0–36.0)
MCV: 92.3 fL (ref 78.0–100.0)
MONO ABS: 0.4 10*3/uL (ref 0.1–1.0)
Monocytes Relative: 6 % (ref 3–12)
NEUTROS ABS: 4.4 10*3/uL (ref 1.7–7.7)
Neutrophils Relative %: 72 % (ref 43–77)
Platelets: 98 10*3/uL — ABNORMAL LOW (ref 150–400)
RBC: 4.95 MIL/uL (ref 4.22–5.81)
RDW: 13.4 % (ref 11.5–15.5)
WBC: 6 10*3/uL (ref 4.0–10.5)

## 2013-03-21 LAB — LIPID PANEL
Cholesterol: 193 mg/dL (ref 0–200)
HDL: 40 mg/dL (ref >39)
LDL CALC: 97 mg/dL (ref 0–99)
Total CHOL/HDL Ratio: 4.8 Ratio
Triglycerides: 280 mg/dL — ABNORMAL HIGH (ref <150)
VLDL: 56 mg/dL — ABNORMAL HIGH (ref 0–40)

## 2013-03-21 MED ORDER — SERTRALINE HCL 50 MG PO TABS: 50.0000 mg | ORAL_TABLET | Freq: Three times a day (TID) | ORAL | Status: DC

## 2013-03-21 MED ORDER — PRAVASTATIN SODIUM 40 MG PO TABS: 40.0000 mg | ORAL_TABLET | Freq: Every day | ORAL | Status: DC

## 2013-03-21 MED ORDER — OMEPRAZOLE 40 MG PO CPDR: 40.0000 mg | DELAYED_RELEASE_CAPSULE | Freq: Every day | ORAL | Status: DC

## 2013-03-21 MED ORDER — MOMETASONE FUROATE 0.1 % EX OINT: TOPICAL_OINTMENT | Freq: Every day | CUTANEOUS | Status: DC

## 2013-03-21 MED ORDER — AMLODIPINE BESYLATE 10 MG PO TABS: 10.0000 mg | ORAL_TABLET | Freq: Every day | ORAL | Status: DC

## 2013-03-21 NOTE — Progress Notes (Signed)
Subjective:    Patient ID: [name] [name], male    DOB: 1930/03/09, 80 y.o.   MRN: [id number]  HPI Patient's here today for followup of his hypertension. His current amlodipine 10 mg by mouth daily. He has a history of chronic kidney disease stage IV and a 5 cm AAA. [facility]. They're hesitant to repair the AAA due to the location near the renal artery because it would cause renal failure and we'll try the patient on dialysis. Currently they're monitoring him every 6 months an aneurysm grows larger than 6 cm there were apparent. He denies any chest pain shortness of breath or dyspnea on exertion. He denies any myalgias right quadrant pain on pravastatin. He does complain of 3 separate lesions on his body. There is one lesion on his medial left ankle that is 3 cm x 2 and half centimeters. There is another lesion that is 2 cm on his medial right elbow. Each of these are indurated erythematous and scaly plaques.  They're extremely itchy. Past Medical History  Diagnosis Date  . AAA (abdominal aortic aneurysm)   . Benign hypertension with CKD (chronic kidney disease) stage IV   . HTN (hypertension)   . Hyperlipidemia    Current Outpatient Prescriptions on File Prior to Visit  Medication Sig Dispense Refill  . CALCITRIOL PO Take 0.25 mg by mouth daily.      . Calcium Acetate 667 MG TABS Take 667 mg by mouth 3 (three) times daily.      . Cholecalciferol (VITAMIN D) 2000 UNITS CAPS Take 2,000 Units by mouth daily.      . Omega-3 Fatty Acids (FISH OIL) 1000 MG CAPS Take 1 capsule by mouth 3 (three) times daily.      . sodium bicarbonate 648 MG tablet Take 1 tablet (648 mg total) by mouth 2 (two) times daily. 2 tabs by mouth twice daily  120 tablet  4   No current facility-administered medications on file prior to visit.   No Known Allergies History   Social History  . Marital Status: Married    Spouse Name: N/A    Number of Children: N/A  .  Years of Education: N/A   Occupational History  . Not on file.   Social History Main Topics  . Smoking status: Former Smoker    Types: Cigarettes  . Smokeless tobacco: Former NeurosurgeonUser    Quit date: 03/09/1988  . Alcohol Use: No  . Drug Use: No  . Sexual Activity: Not on file   Other Topics Concern  . Not on file   Social History Narrative  . No narrative on file      Review of Systems  All other systems reviewed and are negative.       Objective:   Physical Exam  Vitals reviewed. Neck: Neck supple. No JVD present. No thyromegaly present.  Cardiovascular: Normal rate, regular rhythm and normal heart sounds.  Exam reveals no gallop and no friction rub.   No murmur heard. Pulmonary/Chest: Effort normal and breath sounds normal. No respiratory distress. He has no wheezes. He has no rales.  Abdominal: Soft. Bowel sounds are normal. He exhibits no distension and no mass. There is no tenderness. There is no rebound and no guarding.  Musculoskeletal: He exhibits no edema.  Lymphadenopathy:    He has no cervical adenopathy.  Skin: Rash noted.   patient has left basilar crackles  Assessment & Plan:  1. Nummular eczema I believe his rash represents nummular eczema. I will try Elocon ointment once a day for 2 weeks. If the lesions do not improve I recommend a punch biopsy. - mometasone (ELOCON) 0.1 % ointment; Apply topically daily.  Dispense: 45 g; Refill: 0  2. HTN (hypertension) Blood pressures currently well controlled. Continue current medications at the present dosages. I will check a fasting lipid panel. - COMPLETE METABOLIC PANEL WITH GFR - CBC with Differential - Lipid panel  3. Lung field abnormal finding on examination Due to the abnormality on his lung exam I obtained a chest x-ray to rule out pathologic lesion in the lower left lobe. - DG Chest 2 View; Future

## 2013-03-22 ENCOUNTER — Ambulatory Visit
Admission: RE | Admit: 2013-03-22 | Discharge: 2013-03-22 | Disposition: A | Discharge status: Home / Self Care | Payer: Medicare PPO | Source: Ambulatory Visit | Attending: Family Medicine | Admitting: Family Medicine

## 2013-03-22 DIAGNOSIS — R918 Other nonspecific abnormal finding of lung field: Secondary | ICD-10-CM

## 2013-03-24 ENCOUNTER — Other Ambulatory Visit: Payer: Self-pay | Admitting: Family Medicine

## 2013-03-24 MED ORDER — PRAVASTATIN SODIUM 80 MG PO TABS: 80.0000 mg | ORAL_TABLET | Freq: Every day | ORAL | Status: DC

## 2013-03-24 MED ORDER — CALCIUM ACETATE 667 MG PO TABS: 667.0000 mg | ORAL_TABLET | Freq: Three times a day (TID) | ORAL | Status: DC

## 2013-03-24 MED ORDER — SODIUM BICARBONATE 648 MG PO TABS: ORAL_TABLET | ORAL | Status: DC

## 2013-03-24 MED ORDER — CALCITRIOL 0.25 MCG PO CAPS: 0.2500 ug | ORAL_CAPSULE | Freq: Every day | ORAL | Status: DC

## 2013-03-28 ENCOUNTER — Other Ambulatory Visit: Payer: Self-pay | Admitting: Family Medicine

## 2013-03-28 MED ORDER — SODIUM BICARBONATE 650 MG PO TABS: 1300.0000 mg | ORAL_TABLET | Freq: Two times a day (BID) | ORAL | Status: DC

## 2013-03-31 ENCOUNTER — Other Ambulatory Visit: Payer: Self-pay | Admitting: Family Medicine

## 2013-03-31 MED ORDER — AMLODIPINE BESYLATE 10 MG PO TABS: 10.0000 mg | ORAL_TABLET | Freq: Every day | ORAL | Status: DC

## 2013-03-31 MED ORDER — SERTRALINE HCL 50 MG PO TABS: 50.0000 mg | ORAL_TABLET | Freq: Three times a day (TID) | ORAL | Status: DC

## 2013-03-31 MED ORDER — OMEPRAZOLE 40 MG PO CPDR: 40.0000 mg | DELAYED_RELEASE_CAPSULE | Freq: Every day | ORAL | Status: DC

## 2013-03-31 MED ORDER — CALCITRIOL 0.25 MCG PO CAPS: 0.2500 ug | ORAL_CAPSULE | Freq: Every day | ORAL | Status: DC

## 2013-04-05 ENCOUNTER — Other Ambulatory Visit: Payer: Self-pay | Admitting: Family Medicine

## 2013-04-05 MED ORDER — SERTRALINE HCL 50 MG PO TABS: 50.0000 mg | ORAL_TABLET | Freq: Three times a day (TID) | ORAL | Status: DC

## 2013-04-07 ENCOUNTER — Other Ambulatory Visit: Payer: Self-pay | Admitting: Family Medicine

## 2013-04-07 MED ORDER — AMLODIPINE BESYLATE 10 MG PO TABS
10.0000 mg | ORAL_TABLET | Freq: Every day | ORAL | Status: DC
Start: 2013-04-07 — End: 2014-04-23

## 2013-04-07 MED ORDER — OMEPRAZOLE 40 MG PO CPDR: 40.0000 mg | DELAYED_RELEASE_CAPSULE | Freq: Every day | ORAL | Status: DC

## 2013-04-07 MED ORDER — SERTRALINE HCL 50 MG PO TABS
50.0000 mg | ORAL_TABLET | Freq: Three times a day (TID) | ORAL | Status: DC
Start: 2013-04-07 — End: 2014-04-23

## 2013-08-21 ENCOUNTER — Other Ambulatory Visit: Payer: Medicare HMO

## 2013-08-21 DIAGNOSIS — Z79899 Other long term (current) drug therapy: Secondary | ICD-10-CM

## 2013-08-21 DIAGNOSIS — Z125 Encounter for screening for malignant neoplasm of prostate: Secondary | ICD-10-CM

## 2013-08-21 DIAGNOSIS — I10 Essential (primary) hypertension: Secondary | ICD-10-CM

## 2013-08-21 DIAGNOSIS — E782 Mixed hyperlipidemia: Secondary | ICD-10-CM

## 2013-08-21 LAB — COMPREHENSIVE METABOLIC PANEL
ALT: <8 U/L (ref 0–53)
AST: 12 U/L (ref 0–37)
Albumin: 4 g/dL (ref 3.5–5.2)
Alkaline Phosphatase: 65 U/L (ref 39–117)
BUN: 23 mg/dL (ref 6–23)
CO2: 29 mEq/L (ref 19–32)
Calcium: 9.5 mg/dL (ref 8.4–10.5)
Chloride: 102 mEq/L (ref 96–112)
Creat: 2.09 mg/dL — ABNORMAL HIGH (ref 0.50–1.35)
Glucose, Bld: 102 mg/dL — ABNORMAL HIGH (ref 70–99)
Potassium: 4.1 mEq/L (ref 3.5–5.3)
Sodium: 142 mEq/L (ref 135–145)
Total Bilirubin: 0.9 mg/dL (ref 0.2–1.2)
Total Protein: 6.7 g/dL (ref 6.0–8.3)

## 2013-08-21 LAB — CBC WITH DIFFERENTIAL/PLATELET
BASOS PCT: 1 % (ref 0–1)
Basophils Absolute: 0.1 10*3/uL (ref 0.0–0.1)
Eosinophils Absolute: 0.2 10*3/uL (ref 0.0–0.7)
Eosinophils Relative: 4 % (ref 0–5)
HCT: 40.5 % (ref 39.0–52.0)
HEMOGLOBIN: 13.6 g/dL (ref 13.0–17.0)
Lymphocytes Relative: 19 % (ref 12–46)
Lymphs Abs: 1.2 10*3/uL (ref 0.7–4.0)
MCH: 30.8 pg (ref 26.0–34.0)
MCHC: 33.6 g/dL (ref 30.0–36.0)
MCV: 91.6 fL (ref 78.0–100.0)
Monocytes Absolute: 0.3 10*3/uL (ref 0.1–1.0)
Monocytes Relative: 5 % (ref 3–12)
NEUTROS ABS: 4.3 10*3/uL (ref 1.7–7.7)
NEUTROS PCT: 71 % (ref 43–77)
Platelets: 111 10*3/uL — ABNORMAL LOW (ref 150–400)
RBC: 4.42 MIL/uL (ref 4.22–5.81)
RDW: 14.4 % (ref 11.5–15.5)
WBC: 6.1 10*3/uL (ref 4.0–10.5)

## 2013-08-21 LAB — LIPID PANEL
Cholesterol: 155 mg/dL (ref 0–200)
HDL: 33 mg/dL — ABNORMAL LOW (ref >39)
LDL Cholesterol: 81 mg/dL (ref 0–99)
Total CHOL/HDL Ratio: 4.7 Ratio
Triglycerides: 206 mg/dL — ABNORMAL HIGH (ref <150)
VLDL: 41 mg/dL — ABNORMAL HIGH (ref 0–40)

## 2013-08-22 LAB — PSA, MEDICARE: PSA: 1.86 ng/mL (ref <=4.00)

## 2013-08-28 ENCOUNTER — Ambulatory Visit (INDEPENDENT_AMBULATORY_CARE_PROVIDER_SITE_OTHER): Payer: Medicare HMO | Admitting: Family Medicine

## 2013-08-28 ENCOUNTER — Telehealth: Payer: Self-pay | Admitting: *Deleted

## 2013-08-28 ENCOUNTER — Encounter: Payer: Self-pay | Admitting: Family Medicine

## 2013-08-28 VITALS — BP 142/70 | HR 74 | Temp 97.0°F | Resp 20 | Ht 66.5 in | Wt 140.0 lb

## 2013-08-28 DIAGNOSIS — Z0000 Encounter for general adult medical examination without abnormal findings: Secondary | ICD-10-CM

## 2013-08-28 DIAGNOSIS — Z23 Encounter for immunization: Secondary | ICD-10-CM

## 2013-08-28 DIAGNOSIS — I714 Abdominal aortic aneurysm, without rupture, unspecified: Secondary | ICD-10-CM

## 2013-08-28 MED ORDER — METOPROLOL SUCCINATE ER 25 MG PO TB24: 25.0000 mg | ORAL_TABLET | Freq: Every day | ORAL | Status: DC

## 2013-08-28 NOTE — Progress Notes (Signed)
Subjective:    Patient ID: [name] [name], male    DOB: 08-15-1929, 80 y.o.   MRN: [id number]  HPI Patient is here today for complete physical exam.  He has a history of chronic kidney disease stage IV and a 5 cm AAA. He is currently being monitored by vascular surgery at [facility]. They're hesitant to repair the AAA due to the location near the renal artery because it would cause renal failure and would likely lead to dialysis. Currently they're monitoring him every 6 months and if the aneurysm grows larger than 6 cm there will operate.  Unfortunately, Dr. Eddie Dibbles office at [facility] no longer accepts the patient's insurance. He needs to establish with a new vascular surgeon. He also complains of chronic daily constipation that has not improved with increasing fluids and fiber. He also has a white plaque on his palm that has been there for 2 weeks. He denies any sore throat. He denies any acid reflux. He appears to have a mild Schroeter of thrush. Otherwise he is here today for a complete physical exam. His depression screen is normal. He has no history of falls. Patient has no difficulty performing ADLs.  He has had Pneumovax. He is due for Prevnar 13. The is a refuses any future colonoscopies her prostate cancer screening. His most recent labwork as listed below: Lab on 08/21/2013  Component Date Value Ref Range Status  . WBC 08/21/2013 6.1  4.0 - 10.5 K/uL Final  . RBC 08/21/2013 4.42  4.22 - 5.81 MIL/uL Final  . Hemoglobin 08/21/2013 13.6  13.0 - 17.0 g/dL Final  . HCT 08/21/2013 40.5  39.0 - 52.0 % Final  . MCV 08/21/2013 91.6  78.0 - 100.0 fL Final  . MCH 08/21/2013 30.8  26.0 - 34.0 pg Final  . MCHC 08/21/2013 33.6  30.0 - 36.0 g/dL Final  . RDW 08/21/2013 14.4  11.5 - 15.5 % Final  . Platelets 08/21/2013 111* 150 - 400 K/uL Final  . Neutrophils Relative % 08/21/2013 71  43 - 77 % Final  . Neutro Abs 08/21/2013 4.3  1.7 - 7.7 K/uL Final  . Lymphocytes Relative 08/21/2013 19  12 - 46 % Final  . Lymphs  Abs 08/21/2013 1.2  0.7 - 4.0 K/uL Final  . Monocytes Relative 08/21/2013 5  3 - 12 % Final  . Monocytes Absolute 08/21/2013 0.3  0.1 - 1.0 K/uL Final  . Eosinophils Relative 08/21/2013 4  0 - 5 % Final  . Eosinophils Absolute 08/21/2013 0.2  0.0 - 0.7 K/uL Final  . Basophils Relative 08/21/2013 1  0 - 1 % Final  . Basophils Absolute 08/21/2013 0.1  0.0 - 0.1 K/uL Final  . Smear Review 08/21/2013 Criteria for review not met   Final  . Sodium 08/21/2013 142  135 - 145 mEq/L Final  . Potassium 08/21/2013 4.1  3.5 - 5.3 mEq/L Final  . Chloride 08/21/2013 102  96 - 112 mEq/L Final  . CO2 08/21/2013 29  19 - 32 mEq/L Final  . Glucose, Bld 08/21/2013 102* 70 - 99 mg/dL Final  . BUN 08/21/2013 23  6 - 23 mg/dL Final  . Creat 08/21/2013 2.09* 0.50 - 1.35 mg/dL Final  . Total Bilirubin 08/21/2013 0.9  0.2 - 1.2 mg/dL Final  . Alkaline Phosphatase 08/21/2013 65  39 - 117 U/L Final  . AST 08/21/2013 12  0 - 37 U/L Final  . ALT 08/21/2013 <8  0 - 53 U/L Final  . Total  Protein 08/21/2013 6.7  6.0 - 8.3 g/dL Final  . Albumin 08/21/2013 4.0  3.5 - 5.2 g/dL Final  . Calcium 08/21/2013 9.5  8.4 - 10.5 mg/dL Final  . Cholesterol 08/21/2013 155  0 - 200 mg/dL Final   Comment: ATP III Classification:                                < 200        mg/dL        Desirable                               200 - 239     mg/dL        Borderline High                               >= 240        mg/dL        High                             . Triglycerides 08/21/2013 206* <150 mg/dL Final  . HDL 08/21/2013 33* >39 mg/dL Final  . Total CHOL/HDL Ratio 08/21/2013 4.7   Final  . VLDL 08/21/2013 41* 0 - 40 mg/dL Final  . LDL Cholesterol 08/21/2013 81  0 - 99 mg/dL Final   Comment:                            Total Cholesterol/HDL Ratio:CHD Risk                                                 Coronary Heart Disease Risk Table                                                                 Men       Women                                    1/2 Average Risk              3.4        3.3                                       Average Risk              5.0        4.4                                    2X Average Risk              9.6  7.1                                    3X Average Risk             23.4       11.0                          Use the calculated Patient Ratio above and the CHD Risk table                           to determine the patient's CHD Risk.                          ATP III Classification (LDL):                                < 100        mg/dL         Optimal                               100 - 129     mg/dL         Near or Above Optimal                               130 - 159     mg/dL         Borderline High                               160 - 189     mg/dL         High                                > 190        mg/dL         Very High                             . PSA 08/21/2013 1.86  <=4.00 ng/mL Final   Comment: Test Methodology: ECLIA PSA (Electrochemiluminescence Immunoassay)                                                     For PSA values from 2.5-4.0, particularly in younger men <60 years                          old, the AUA and NCCN suggest testing for % Free PSA (3515) and                          evaluation of the rate of increase in PSA (PSA velocity).   Past Medical History  Diagnosis Date  . AAA (abdominal aortic aneurysm)   .  Benign hypertension with CKD (chronic kidney disease) stage IV   . HTN (hypertension)   . Hyperlipidemia    No past surgical history on file. Current Outpatient Prescriptions on File Prior to Visit  Medication Sig Dispense Refill  . amLODipine (NORVASC) 10 MG tablet Take 1 tablet (10 mg total) by mouth daily.  90 tablet  4  . calcitRIOL (ROCALTROL) 0.25 MCG capsule Take 1 capsule (0.25 mcg total) by mouth daily.  90 capsule  4  . Cholecalciferol (VITAMIN D) 2000 UNITS CAPS Take 2,000 Units by mouth daily.      . Omega-3 Fatty Acids (FISH  OIL) 1000 MG CAPS Take 1 capsule by mouth 3 (three) times daily.      . pravastatin (PRAVACHOL) 80 MG tablet Take 1 tablet (80 mg total) by mouth daily.  90 tablet  3  . sertraline (ZOLOFT) 50 MG tablet Take 1 tablet (50 mg total) by mouth 3 (three) times daily.  270 tablet  4  . sodium bicarbonate 650 MG tablet Take 2 tablets (1,300 mg total) by mouth 2 (two) times daily.  360 tablet  4  . omeprazole (PRILOSEC) 40 MG capsule Take 1 capsule (40 mg total) by mouth daily.  90 capsule  4   No current facility-administered medications on file prior to visit.   No Known Allergies History   Social History  . Marital Status: Married    Spouse Name: N/A    Number of Children: N/A  . Years of Education: N/A   Occupational History  . Not on file.   Social History Main Topics  . Smoking status: Former Smoker    Types: Cigarettes  . Smokeless tobacco: Former Systems developer    Quit date: 03/09/1988  . Alcohol Use: No  . Drug Use: No  . Sexual Activity: Yes   Other Topics Concern  . Not on file   Social History Narrative  . No narrative on file   Family history is no longer contributory at his age.     Review of Systems  All other systems reviewed and are negative.      Objective:   Physical Exam  Vitals reviewed. Constitutional: He is oriented to person, place, and time. He appears well-developed and well-nourished. No distress.  HENT:  Head: Normocephalic and atraumatic.  Right Ear: External ear normal.  Left Ear: External ear normal.  Nose: Nose normal.  Mouth/Throat: Oropharynx is clear and moist. No oropharyngeal exudate.  Eyes: Conjunctivae and EOM are normal. Pupils are equal, round, and reactive to light. Right eye exhibits no discharge. Left eye exhibits no discharge. No scleral icterus.  Neck: Normal range of motion. Neck supple. No JVD present. No tracheal deviation present. No thyromegaly present.  Cardiovascular: Normal rate, regular rhythm and normal heart sounds.  Exam  reveals no gallop and no friction rub.   No murmur heard. Pulmonary/Chest: Effort normal and breath sounds normal. No stridor. No respiratory distress. He has no wheezes. He has no rales. He exhibits no tenderness.  Abdominal: Soft. Bowel sounds are normal. He exhibits no distension. There is no tenderness. There is no rebound and no guarding.  Musculoskeletal: Normal range of motion. He exhibits no edema and no tenderness.  Lymphadenopathy:    He has no cervical adenopathy.  Neurological: He is alert and oriented to person, place, and time. He has normal reflexes. He displays normal reflexes. No cranial nerve deficit. He exhibits normal muscle tone. Coordination normal.  Skin: Skin is warm. No rash noted.  He is not diaphoretic. No erythema. No pallor.  Psychiatric: He has a normal mood and affect. His behavior is normal. Judgment and thought content normal.  rhinophyma        Assessment & Plan:  1. AAA (abdominal aortic aneurysm) without rupture 2. CPE - Ambulatory referral to Vascular Surgery  I will consult vascular surgery to arrange future followup. Patient is overdue for repeat ultrasound of his AAA. There schedule this as soon as possible. His lab work is excellent. His renal function remained stable. His blood pressure is slightly elevated today. Therefore I will add Toprol-XL 25 mg by mouth daily. Recheck blood pressure in one month. The patient also received Prevnar 13 today in the office. I agree that colon cancer screening and prostate cancer screening are not beneficial than his age and medical comorbidities. Therefore we'll not screening for this.  Start linzess 145 mg poqday for constipation.  Try magic mouthwash 1 tsp poqid for 1 week for thrush.  Recheck if no better in one week.

## 2013-08-28 NOTE — Telephone Encounter (Signed)
Submitted referral thru [employer] for [employer] care mgmt for authorization, pending authorization at this time.

## 2013-08-28 NOTE — Addendum Note (Signed)
Addended by: Legrand Rams B on: 08/28/2013 03:42 PM   Modules accepted: Orders

## 2013-09-01 NOTE — Telephone Encounter (Signed)
[employer] care mgmt authorization # [id number], this information will be faxed to Dr. Eben Burow [facility] vascular [facility] at [phone]

## 2013-09-01 NOTE — Telephone Encounter (Signed)
Called [employer] care Mgmt and pt referral is still in pending d/t Dr. Eben Burow is not in network and will take up to min.14 days for authorization to come through.

## 2013-09-01 NOTE — Telephone Encounter (Signed)
Checked status of authorizations thru [employer] and still pending.

## 2013-12-25 ENCOUNTER — Telehealth: Payer: Self-pay | Admitting: *Deleted

## 2013-12-25 NOTE — Telephone Encounter (Signed)
Received a call from Selena Batten at [facility] stating that pt has [employer] and needed a authorization, submitted authorization thru [facility] connect with authorizaton number [id number] for Dr. Hyman Hopes. Once receive paper copy will fax over to [facility].

## 2014-01-02 ENCOUNTER — Telehealth: Payer: Self-pay | Admitting: *Deleted

## 2014-01-02 NOTE — Telephone Encounter (Signed)
Received call from Dr. Hebert Soho office stating that pt is needing a [employer] referral to go to [facility] to Dr. Pattricia Boss for Thoracic Aortic Aneurysm recommended by Dr. Hyman Hopes. Submitted referral thru acuity connect pending authorization.Call authorization to [place] at [phone].

## 2014-01-05 NOTE — Telephone Encounter (Signed)
Received authorization from [facility] care mgmt with authorization number [id number], called and spoke with Joyce Gross and she has the number also, and is going to call shaquina.

## 2014-02-23 ENCOUNTER — Other Ambulatory Visit: Payer: Self-pay | Admitting: Family Medicine

## 2014-02-23 NOTE — Telephone Encounter (Signed)
Medication refilled per protocol. 

## 2014-04-23 ENCOUNTER — Other Ambulatory Visit: Payer: Self-pay | Admitting: Family Medicine

## 2014-10-20 ENCOUNTER — Other Ambulatory Visit: Payer: Self-pay | Admitting: Family Medicine

## 2014-10-26 ENCOUNTER — Other Ambulatory Visit: Payer: Self-pay | Admitting: Family Medicine

## 2014-10-26 MED ORDER — PRAVASTATIN SODIUM 80 MG PO TABS: 80.0000 mg | ORAL_TABLET | Freq: Every day | ORAL | Status: DC

## 2014-11-14 ENCOUNTER — Other Ambulatory Visit: Payer: Medicare HMO

## 2014-11-14 DIAGNOSIS — Z0000 Encounter for general adult medical examination without abnormal findings: Secondary | ICD-10-CM

## 2014-11-14 DIAGNOSIS — E038 Other specified hypothyroidism: Secondary | ICD-10-CM | POA: Insufficient documentation

## 2014-11-14 DIAGNOSIS — I129 Hypertensive chronic kidney disease with stage 1 through stage 4 chronic kidney disease, or unspecified chronic kidney disease: Secondary | ICD-10-CM

## 2014-11-14 DIAGNOSIS — N184 Chronic kidney disease, stage 4 (severe): Secondary | ICD-10-CM

## 2014-11-14 DIAGNOSIS — E785 Hyperlipidemia, unspecified: Secondary | ICD-10-CM

## 2014-11-14 DIAGNOSIS — E039 Hypothyroidism, unspecified: Secondary | ICD-10-CM | POA: Insufficient documentation

## 2014-11-14 DIAGNOSIS — Z79899 Other long term (current) drug therapy: Secondary | ICD-10-CM

## 2014-11-14 LAB — CBC WITH DIFFERENTIAL/PLATELET
Basophils Absolute: 0.1 10*3/uL (ref 0.0–0.1)
Basophils Relative: 1 % (ref 0–1)
Eosinophils Absolute: 0.2 10*3/uL (ref 0.0–0.7)
Eosinophils Relative: 4 % (ref 0–5)
HEMATOCRIT: 41.9 % (ref 39.0–52.0)
HEMOGLOBIN: 13.8 g/dL (ref 13.0–17.0)
LYMPHS PCT: 16 % (ref 12–46)
Lymphs Abs: 0.8 10*3/uL (ref 0.7–4.0)
MCH: 30.7 pg (ref 26.0–34.0)
MCHC: 32.9 g/dL (ref 30.0–36.0)
MCV: 93.3 fL (ref 78.0–100.0)
MONO ABS: 0.3 10*3/uL (ref 0.1–1.0)
MPV: 10.1 fL (ref 8.6–12.4)
Monocytes Relative: 6 % (ref 3–12)
Neutro Abs: 3.7 10*3/uL (ref 1.7–7.7)
Neutrophils Relative %: 73 % (ref 43–77)
Platelets: 102 10*3/uL — ABNORMAL LOW (ref 150–400)
RBC: 4.49 MIL/uL (ref 4.22–5.81)
RDW: 13.7 % (ref 11.5–15.5)
WBC: 5 10*3/uL (ref 4.0–10.5)

## 2014-11-14 LAB — COMPLETE METABOLIC PANEL WITH GFR
ALBUMIN: 4 g/dL (ref 3.6–5.1)
ALT: 9 U/L (ref 9–46)
AST: 16 U/L (ref 10–35)
Alkaline Phosphatase: 78 U/L (ref 40–115)
BUN: 22 mg/dL (ref 7–25)
CALCIUM: 8.9 mg/dL (ref 8.6–10.3)
CO2: 26 mmol/L (ref 20–31)
Chloride: 104 mmol/L (ref 98–110)
Creat: 1.85 mg/dL — ABNORMAL HIGH (ref 0.70–1.11)
GFR, Est African American: 38 mL/min — ABNORMAL LOW (ref >=60)
GFR, Est Non African American: 33 mL/min — ABNORMAL LOW (ref >=60)
Glucose, Bld: 97 mg/dL (ref 70–99)
POTASSIUM: 4 mmol/L (ref 3.5–5.3)
Sodium: 141 mmol/L (ref 135–146)
Total Bilirubin: 0.8 mg/dL (ref 0.2–1.2)
Total Protein: 6.5 g/dL (ref 6.1–8.1)

## 2014-11-14 LAB — LIPID PANEL
CHOLESTEROL: 143 mg/dL (ref 125–200)
HDL: 39 mg/dL — AB (ref >=40)
LDL Cholesterol: 68 mg/dL (ref <130)
TRIGLYCERIDES: 179 mg/dL — AB (ref <150)
Total CHOL/HDL Ratio: 3.7 Ratio (ref <=5.0)
VLDL: 36 mg/dL — ABNORMAL HIGH (ref <30)

## 2014-11-14 LAB — TSH: TSH: 4.406 u[IU]/mL (ref 0.350–4.500)

## 2014-11-15 ENCOUNTER — Ambulatory Visit (INDEPENDENT_AMBULATORY_CARE_PROVIDER_SITE_OTHER): Payer: Commercial Managed Care - HMO | Admitting: Family Medicine

## 2014-11-15 ENCOUNTER — Encounter: Payer: Self-pay | Admitting: Family Medicine

## 2014-11-15 VITALS — BP 120/70 | HR 80 | Temp 98.6°F | Resp 20 | Ht 66.5 in | Wt 136.0 lb

## 2014-11-15 DIAGNOSIS — Z23 Encounter for immunization: Secondary | ICD-10-CM | POA: Diagnosis not present

## 2014-11-15 DIAGNOSIS — Z0000 Encounter for general adult medical examination without abnormal findings: Secondary | ICD-10-CM | POA: Diagnosis not present

## 2014-11-15 NOTE — Progress Notes (Signed)
Subjective:    Patient ID: [name] [name], male    DOB: 1929-08-22, 81 y.o.   MRN: [id number]  HPI Patient is here today for his physical exam. He has no medical concerns. He denies any recent falls. He denies any instability with walking. He denies any depression. He denies any memory loss. Patient's Pneumovax 23 and Prevnar 13 are up-to-date. He is due for a tetanus shot which he declines today. He is also due for the shingles vaccine as well as a flu shot. Initially he declined the flu shot, however he agreed to the flu shot after our discussion. He has a history of a large abdominal aortic aneurysm near his renal artery. This is followed by vascular surgery at [facility]. At the present time they have elected only to monitor given the fact that surgery would likely require the patient to go on lifelong dialysis. He is due to see them again this fall. Patient denies any vision problems. He denies any significant hearing problems. Past Medical History  Diagnosis Date  . AAA (abdominal aortic aneurysm)   . Benign hypertension with CKD (chronic kidney disease) stage IV   . HTN (hypertension)   . Hyperlipidemia    No past surgical history on file. Current Outpatient Prescriptions on File Prior to Visit  Medication Sig Dispense Refill  . amLODipine (NORVASC) 10 MG tablet TAKE 1 TABLET EVERY DAY 90 tablet 3  . Cholecalciferol (VITAMIN D) 2000 UNITS CAPS Take 2,000 Units by mouth daily.    . pravastatin (PRAVACHOL) 80 MG tablet Take 1 tablet (80 mg total) by mouth daily. 90 tablet 0  . sertraline (ZOLOFT) 50 MG tablet TAKE 1 TABLET THREE TIMES DAILY 270 tablet 3  . sodium bicarbonate 650 MG tablet TAKE 2 TABLETS TWICE DAILY 360 tablet 3   No current facility-administered medications on file prior to visit.   No Known Allergies Social History   Social History  . Marital Status: Married    Spouse Name: N/A  . Number of Children: N/A  . Years of Education: N/A   Occupational History  . Not on  file.   Social History Main Topics  . Smoking status: Former Smoker    Types: Cigarettes  . Smokeless tobacco: Former Systems developer    Quit date: 03/09/1988  . Alcohol Use: No  . Drug Use: No  . Sexual Activity: Yes   Other Topics Concern  . Not on file   Social History Narrative   No family history on file.   Review of Systems  All other systems reviewed and are negative.      Objective:   Physical Exam  Constitutional: He is oriented to person, place, and time. He appears well-developed and well-nourished. No distress.  HENT:  Head: Normocephalic and atraumatic.  Right Ear: External ear normal.  Left Ear: External ear normal.  Nose: Nose normal.  Mouth/Throat: Oropharynx is clear and moist. No oropharyngeal exudate.  Eyes: Conjunctivae and EOM are normal. Pupils are equal, round, and reactive to light. Right eye exhibits no discharge. Left eye exhibits no discharge. No scleral icterus.  Neck: Normal range of motion. Neck supple. No JVD present. No tracheal deviation present. No thyromegaly present.  Cardiovascular: Normal rate, regular rhythm, normal heart sounds and intact distal pulses.  Exam reveals no gallop and no friction rub.   No murmur heard. Pulmonary/Chest: Effort normal and breath sounds normal. No stridor. No respiratory distress. He has no wheezes. He has no rales. He exhibits no tenderness.  Abdominal: Soft. Bowel sounds are normal. He exhibits no distension and no mass. There is no tenderness. There is no rebound and no guarding.  Musculoskeletal: Normal range of motion. He exhibits no edema or tenderness.  Lymphadenopathy:    He has no cervical adenopathy.  Neurological: He is alert and oriented to person, place, and time. He has normal reflexes. He displays normal reflexes. No cranial nerve deficit. He exhibits normal muscle tone. Coordination normal.  Skin: Skin is warm. No rash noted. He is not diaphoretic. No erythema. No pallor.  Psychiatric: He has a normal  mood and affect. His behavior is normal. Judgment and thought content normal.  Vitals reviewed.         Assessment & Plan:  Routine general medical examination at a health care facility  Patient's physical exam is normal. His blood pressure is excellent. I reviewed his most recent lab work: Lab on 11/14/2014  Component Date Value Ref Range Status  . Sodium 11/14/2014 141  135 - 146 mmol/L Final  . Potassium 11/14/2014 4.0  3.5 - 5.3 mmol/L Final  . Chloride 11/14/2014 104  98 - 110 mmol/L Final  . CO2 11/14/2014 26  20 - 31 mmol/L Final  . Glucose, Bld 11/14/2014 97  70 - 99 mg/dL Final  . BUN 11/14/2014 22  7 - 25 mg/dL Final  . Creat 11/14/2014 1.85* 0.70 - 1.11 mg/dL Final  . Total Bilirubin 11/14/2014 0.8  0.2 - 1.2 mg/dL Final  . Alkaline Phosphatase 11/14/2014 78  40 - 115 U/L Final  . AST 11/14/2014 16  10 - 35 U/L Final  . ALT 11/14/2014 9  9 - 46 U/L Final  . Total Protein 11/14/2014 6.5  6.1 - 8.1 g/dL Final  . Albumin 11/14/2014 4.0  3.6 - 5.1 g/dL Final  . Calcium 11/14/2014 8.9  8.6 - 10.3 mg/dL Final  . GFR, Est African American 11/14/2014 38* >=60 mL/min Final  . GFR, Est Non African American 11/14/2014 33* >=60 mL/min Final   Comment:   The estimated GFR is a calculation valid for adults (>=9 years old) that uses the CKD-EPI algorithm to adjust for age and sex. It is   not to be used for children, pregnant women, hospitalized patients,    patients on dialysis, or with rapidly changing kidney function. According to the NKDEP, eGFR >89 is normal, 60-89 shows mild impairment, 30-59 shows moderate impairment, 15-29 shows severe impairment and <15 is ESRD.     . TSH 11/14/2014 4.406  0.350 - 4.500 uIU/mL Final   Comment:   Footnotes:  (1) ** Please note change in unit of measure and reference range(s). **     . Cholesterol 11/14/2014 143  125 - 200 mg/dL Final  . Triglycerides 11/14/2014 179* <150 mg/dL Final  . HDL 11/14/2014 39* >=40 mg/dL Final  .  Total CHOL/HDL Ratio 11/14/2014 3.7  <=5.0 Ratio Final  . VLDL 11/14/2014 36* <30 mg/dL Final  . LDL Cholesterol 11/14/2014 68  <130 mg/dL Final   Comment:   Total Cholesterol/HDL Ratio:CHD Risk                        Coronary Heart Disease Risk Table                                        Men       Women  1/2 Average Risk              3.4        3.3              Average Risk              5.0        4.4           2X Average Risk              9.6        7.1           3X Average Risk             23.4       11.0 Use the calculated Patient Ratio above and the CHD Risk table  to determine the patient's CHD Risk.   . WBC 11/14/2014 5.0  4.0 - 10.5 K/uL Final  . RBC 11/14/2014 4.49  4.22 - 5.81 MIL/uL Final  . Hemoglobin 11/14/2014 13.8  13.0 - 17.0 g/dL Final  . HCT 11/14/2014 41.9  39.0 - 52.0 % Final  . MCV 11/14/2014 93.3  78.0 - 100.0 fL Final  . MCH 11/14/2014 30.7  26.0 - 34.0 pg Final  . MCHC 11/14/2014 32.9  30.0 - 36.0 g/dL Final  . RDW 11/14/2014 13.7  11.5 - 15.5 % Final  . Platelets 11/14/2014 102* 150 - 400 K/uL Final  . MPV 11/14/2014 10.1  8.6 - 12.4 fL Final  . Neutrophils Relative % 11/14/2014 73  43 - 77 % Final  . Neutro Abs 11/14/2014 3.7  1.7 - 7.7 K/uL Final  . Lymphocytes Relative 11/14/2014 16  12 - 46 % Final  . Lymphs Abs 11/14/2014 0.8  0.7 - 4.0 K/uL Final  . Monocytes Relative 11/14/2014 6  3 - 12 % Final  . Monocytes Absolute 11/14/2014 0.3  0.1 - 1.0 K/uL Final  . Eosinophils Relative 11/14/2014 4  0 - 5 % Final  . Eosinophils Absolute 11/14/2014 0.2  0.0 - 0.7 K/uL Final  . Basophils Relative 11/14/2014 1  0 - 1 % Final  . Basophils Absolute 11/14/2014 0.1  0.0 - 0.1 K/uL Final  . Smear Review 11/14/2014 Criteria for review not met   Final   Chronic kidney disease is stable. The remainder of his lab work is excellent. Patient gets the flu shot today. The remainder of his vaccinations are up-to-date. I did recommend the shingles shot and he will  consider this. Given his age I recommended against a colonoscopy. Given his age and recommended against routine prostate cancer screening. We discussed the reasons behind this and he agrees. The remainder of his preventative care is up-to-date. Regular anticipatory guidance is provided.

## 2014-11-15 NOTE — Addendum Note (Signed)
Addended by: Legrand Rams B on: 11/15/2014 10:55 AM   Modules accepted: Orders

## 2014-12-24 ENCOUNTER — Telehealth: Payer: Self-pay | Admitting: Family Medicine

## 2014-12-24 NOTE — Telephone Encounter (Signed)
Patients wife is calling regarding getting a kidney referral for this patient  [phone]

## 2015-01-08 ENCOUNTER — Telehealth: Payer: Self-pay | Admitting: *Deleted

## 2015-01-08 NOTE — Telephone Encounter (Signed)
Submitted [employer] referral thru [employer] connect for authorization on 01/07/15 to Dr. Forde Radon with authorization [id number]  Requesting provider: Jaquelyn Bitter  Treating provider: Forde Radon  Number of visits:6  Start Date: 01/07/15  End Date:07/06/15  Dx:N18.4-Chronic kidney disease,stage 4 (severe)

## 2015-02-04 ENCOUNTER — Other Ambulatory Visit: Payer: Self-pay | Admitting: Family Medicine

## 2015-02-04 NOTE — Telephone Encounter (Signed)
Medication refilled per protocol. 

## 2015-04-22 ENCOUNTER — Ambulatory Visit (INDEPENDENT_AMBULATORY_CARE_PROVIDER_SITE_OTHER): Payer: PPO | Admitting: Family Medicine

## 2015-04-22 ENCOUNTER — Encounter: Payer: Self-pay | Admitting: Family Medicine

## 2015-04-22 VITALS — BP 146/78 | HR 74 | Temp 97.3°F | Resp 14 | Ht 66.5 in | Wt 140.0 lb

## 2015-04-22 DIAGNOSIS — L259 Unspecified contact dermatitis, unspecified cause: Secondary | ICD-10-CM | POA: Diagnosis not present

## 2015-04-22 MED ORDER — PREDNISONE 20 MG PO TABS: ORAL_TABLET | ORAL | Status: DC

## 2015-04-22 NOTE — Progress Notes (Signed)
Subjective:    Patient ID: [name] [name], male    DOB: Mar 05, 1930, 82 y.o.   MRN: [id number]  HPI  Patient has a rash on the dorsum of his left forearm that has been present for 2-1/2 weeks. It is intensely pruritic. The skin from his wrist to proximal to his elbow is violaceous erythematous and warm. There is also lichenifications on the skin consistent with atopic dermatitis.  He denies any pain. He denies any cut or injury. There is no other rash on his upper arm. He has been trying cortisone cream with minimal relief. Neosporin seems to make it worse. Past Medical History  Diagnosis Date  . AAA (abdominal aortic aneurysm) (HCC)   . Benign hypertension with CKD (chronic kidney disease) stage IV (HCC)   . HTN (hypertension)   . Hyperlipidemia    No past surgical history on file. Current Outpatient Prescriptions on File Prior to Visit  Medication Sig Dispense Refill  . amLODipine (NORVASC) 10 MG tablet TAKE 1 TABLET EVERY DAY 90 tablet 3  . pravastatin (PRAVACHOL) 80 MG tablet Take 1 tablet (80 mg total) by mouth daily at 6 PM. 90 tablet 1  . sertraline (ZOLOFT) 50 MG tablet TAKE 1 TABLET THREE TIMES DAILY 270 tablet 3  . sodium bicarbonate 650 MG tablet TAKE 2 TABLETS TWICE DAILY 360 tablet 3   No current facility-administered medications on file prior to visit.   No Known Allergies Social History   Social History  . Marital Status: Married    Spouse Name: N/A  . Number of Children: N/A  . Years of Education: N/A   Occupational History  . Not on file.   Social History Main Topics  . Smoking status: Former Smoker    Types: Cigarettes  . Smokeless tobacco: Former Neurosurgeon    Quit date: 03/09/1988  . Alcohol Use: No  . Drug Use: No  . Sexual Activity: Yes   Other Topics Concern  . Not on file   Social History Narrative   Family history is no longer contributory at his age.     Review of Systems  All other systems reviewed and are negative.      Objective:   Physical Exam  Constitutional: He is oriented to person, place, and time. He appears well-developed and well-nourished. No distress.  HENT:  Head: Normocephalic and atraumatic.  Right Ear: External ear normal.  Left Ear: External ear normal.  Nose: Nose normal.  Mouth/Throat: Oropharynx is clear and moist. No oropharyngeal exudate.  Eyes: Conjunctivae and EOM are normal. Pupils are equal, round, and reactive to light. Right eye exhibits no discharge. Left eye exhibits no discharge. No scleral icterus.  Neck: Normal range of motion. Neck supple. No JVD present. No tracheal deviation present. No thyromegaly present.  Cardiovascular: Normal rate, regular rhythm and normal heart sounds.  Exam reveals no gallop and no friction rub.   No murmur heard. Pulmonary/Chest: Effort normal and breath sounds normal. No stridor. No respiratory distress. He has no wheezes. He has no rales. He exhibits no tenderness.  Abdominal: Soft. Bowel sounds are normal. He exhibits no distension. There is no tenderness. There is no rebound and no guarding.  Musculoskeletal: Normal range of motion. He exhibits no edema or tenderness.  Lymphadenopathy:    He has no cervical adenopathy.  Neurological: He is alert and oriented to person, place, and time. He has normal reflexes. No cranial nerve deficit. He exhibits normal muscle tone. Coordination normal.  Skin: Skin  is warm. Rash noted. He is not diaphoretic. There is erythema. No pallor.  Psychiatric: He has a normal mood and affect. His behavior is normal. Judgment and thought content normal.  Vitals reviewed. rhinophyma        Assessment & Plan:  Contact dermatitis - Plan: predniSONE (DELTASONE) 20 MG tablet  This is not cellulitis. Differential diagnosis includes contact dermatitis, atopic dermatitis, lichen planus, etc. Begin prednisone taper pack. Recheck in one week. If no better, proceed with a skin biopsy.  Discontinue neomycin-containing products.

## 2015-05-14 ENCOUNTER — Encounter: Payer: Self-pay | Admitting: Family Medicine

## 2015-05-14 ENCOUNTER — Ambulatory Visit (INDEPENDENT_AMBULATORY_CARE_PROVIDER_SITE_OTHER): Payer: PPO | Admitting: Family Medicine

## 2015-05-14 VITALS — BP 136/74 | HR 76 | Temp 98.6°F | Resp 18 | Ht 66.5 in | Wt 139.0 lb

## 2015-05-14 DIAGNOSIS — L259 Unspecified contact dermatitis, unspecified cause: Secondary | ICD-10-CM

## 2015-05-14 MED ORDER — MOMETASONE FUROATE 0.1 % EX OINT: TOPICAL_OINTMENT | Freq: Every day | CUTANEOUS | Status: DC

## 2015-05-14 NOTE — Progress Notes (Signed)
Subjective:    Patient ID: [name] [name], male    DOB: 23-Jan-1930, 82 y.o.   MRN: [id number]  HPI 04/22/15 Patient has a rash on the dorsum of his left forearm that has been present for 2-1/2 weeks. It is intensely pruritic. The skin from his wrist to proximal to his elbow is violaceous erythematous and warm. There is also lichenifications on the skin consistent with atopic dermatitis.  He denies any pain. He denies any cut or injury. There is no other rash on his upper arm. He has been trying cortisone cream with minimal relief. Neosporin seems to make it worse.  At that time, my plan was: This is not cellulitis. Differential diagnosis includes contact dermatitis, atopic dermatitis, lichen planus, etc. Begin prednisone taper pack. Recheck in one week. If no better, proceed with a skin biopsy.  Discontinue neomycin-containing products.  05/14/15 While taking prednisone, the rash on his arm almost completely subsided. However after he discontinued the prednisone the rash returned. He continues to have a diffuse erythematous plaque on the dorsum of his left forearm with a violaceous color to the skin. The plaque is comprised of numerous erythematous, violaceous papules. Past Medical History  Diagnosis Date  . AAA (abdominal aortic aneurysm) (HCC)   . Benign hypertension with CKD (chronic kidney disease) stage IV (HCC)   . HTN (hypertension)   . Hyperlipidemia    No past surgical history on file. Current Outpatient Prescriptions on File Prior to Visit  Medication Sig Dispense Refill  . amLODipine (NORVASC) 10 MG tablet TAKE 1 TABLET EVERY DAY 90 tablet 3  . pravastatin (PRAVACHOL) 80 MG tablet Take 1 tablet (80 mg total) by mouth daily at 6 PM. 90 tablet 1  . sertraline (ZOLOFT) 50 MG tablet TAKE 1 TABLET THREE TIMES DAILY 270 tablet 3  . sodium bicarbonate 650 MG tablet TAKE 2 TABLETS TWICE DAILY 360 tablet 3   No current facility-administered medications on file prior to visit.   No Known  Allergies Social History   Social History  . Marital Status: Married    Spouse Name: N/A  . Number of Children: N/A  . Years of Education: N/A   Occupational History  . Not on file.   Social History Main Topics  . Smoking status: Former Smoker    Types: Cigarettes  . Smokeless tobacco: Former Neurosurgeon    Quit date: 03/09/1988  . Alcohol Use: No  . Drug Use: No  . Sexual Activity: Yes   Other Topics Concern  . Not on file   Social History Narrative   Family history is no longer contributory at his age.     Review of Systems  All other systems reviewed and are negative.      Objective:   Physical Exam  Constitutional: He is oriented to person, place, and time. He appears well-developed and well-nourished. No distress.  HENT:  Head: Normocephalic and atraumatic.  Right Ear: External ear normal.  Left Ear: External ear normal.  Nose: Nose normal.  Mouth/Throat: Oropharynx is clear and moist. No oropharyngeal exudate.  Eyes: Conjunctivae and EOM are normal. Pupils are equal, round, and reactive to light. Right eye exhibits no discharge. Left eye exhibits no discharge. No scleral icterus.  Neck: Normal range of motion. Neck supple. No JVD present. No tracheal deviation present. No thyromegaly present.  Cardiovascular: Normal rate, regular rhythm and normal heart sounds.  Exam reveals no gallop and no friction rub.   No murmur heard. Pulmonary/Chest: Effort normal  and breath sounds normal. No stridor. No respiratory distress. He has no wheezes. He has no rales. He exhibits no tenderness.  Abdominal: Soft. Bowel sounds are normal. He exhibits no distension. There is no tenderness. There is no rebound and no guarding.  Musculoskeletal: Normal range of motion. He exhibits no edema or tenderness.  Lymphadenopathy:    He has no cervical adenopathy.  Neurological: He is alert and oriented to person, place, and time. He has normal reflexes. No cranial nerve deficit. He exhibits  normal muscle tone. Coordination normal.  Skin: Skin is warm. Rash noted. He is not diaphoretic. There is erythema. No pallor.  Psychiatric: He has a normal mood and affect. His behavior is normal. Judgment and thought content normal.  Vitals reviewed. rhinophyma        Assessment & Plan:  Contact dermatitis - Plan: mometasone (ELOCON) 0.1 % ointment, Ambulatory referral to Dermatology I believe this is likely lichen planus. However I am not sure and I recommended we set him up to see a dermatologist for a second opinion. Meanwhile begin Elocon ointment applied daily to the rash to try to control it and help with itching. It does not appear to be cellulitis

## 2015-05-20 ENCOUNTER — Telehealth: Payer: Self-pay | Admitting: *Deleted

## 2015-05-20 NOTE — Telephone Encounter (Signed)
Submitted [facility] referral thru [facility] for authorization on 05/14/15 to Dr. Audree Camel with authorization [id number]  Requesting provider: Jaquelyn Bitter  Treating provider: Audree Camel  Number of visits:6  Start Date: 06/04/15  End Date: 12/01/15  Dx: L25.9- Unspecified contact dermatis,unspecified cause

## 2015-06-03 ENCOUNTER — Telehealth: Payer: Self-pay | Admitting: Family Medicine

## 2015-06-03 DIAGNOSIS — L259 Unspecified contact dermatitis, unspecified cause: Secondary | ICD-10-CM

## 2015-06-03 MED ORDER — MOMETASONE FUROATE 0.1 % EX OINT: TOPICAL_OINTMENT | Freq: Every day | CUTANEOUS | Status: DC

## 2015-06-03 MED ORDER — AMLODIPINE BESYLATE 10 MG PO TABS: 10.0000 mg | ORAL_TABLET | Freq: Every day | ORAL | Status: DC

## 2015-06-03 MED ORDER — PRAVASTATIN SODIUM 80 MG PO TABS: 80.0000 mg | ORAL_TABLET | Freq: Every day | ORAL | Status: DC

## 2015-06-03 NOTE — Telephone Encounter (Signed)
Medication called/sent to requested pharmacy  

## 2015-06-03 NOTE — Telephone Encounter (Signed)
PT NEEDS A 3 MO REFILL OF MOMETASONE FUROATE OINTMENT, PRAVASTATIN 80 MG AND AMLODIPINE 10 MG SENT TO [facility] ON [address] [phone]

## 2015-06-05 DIAGNOSIS — L309 Dermatitis, unspecified: Secondary | ICD-10-CM | POA: Diagnosis not present

## 2015-07-24 DIAGNOSIS — N2581 Secondary hyperparathyroidism of renal origin: Secondary | ICD-10-CM | POA: Diagnosis not present

## 2015-07-24 DIAGNOSIS — N189 Chronic kidney disease, unspecified: Secondary | ICD-10-CM | POA: Diagnosis not present

## 2015-07-24 DIAGNOSIS — E785 Hyperlipidemia, unspecified: Secondary | ICD-10-CM | POA: Diagnosis not present

## 2015-07-24 DIAGNOSIS — R809 Proteinuria, unspecified: Secondary | ICD-10-CM | POA: Diagnosis not present

## 2015-07-24 DIAGNOSIS — N184 Chronic kidney disease, stage 4 (severe): Secondary | ICD-10-CM | POA: Diagnosis not present

## 2015-07-24 DIAGNOSIS — D631 Anemia in chronic kidney disease: Secondary | ICD-10-CM | POA: Diagnosis not present

## 2015-08-20 ENCOUNTER — Telehealth: Payer: Self-pay | Admitting: Family Medicine

## 2015-08-20 MED ORDER — SODIUM BICARBONATE 650 MG PO TABS: 1300.0000 mg | ORAL_TABLET | Freq: Two times a day (BID) | ORAL | Status: DC

## 2015-08-20 MED ORDER — AMLODIPINE BESYLATE 10 MG PO TABS: 10.0000 mg | ORAL_TABLET | Freq: Every day | ORAL | Status: DC

## 2015-08-20 NOTE — Telephone Encounter (Signed)
Pt's wife is requesting a amlodipine 10 mg and sodium bicarbonate sent to the [facility] on [address]

## 2015-08-20 NOTE — Telephone Encounter (Signed)
Medication called/sent to requested pharmacy  

## 2015-11-16 ENCOUNTER — Other Ambulatory Visit: Payer: Self-pay | Admitting: Family Medicine

## 2015-11-18 ENCOUNTER — Telehealth: Payer: Self-pay | Admitting: Family Medicine

## 2015-11-18 MED ORDER — SERTRALINE HCL 50 MG PO TABS: 50.0000 mg | ORAL_TABLET | Freq: Three times a day (TID) | ORAL | 3 refills | Status: DC

## 2015-11-18 NOTE — Telephone Encounter (Signed)
Medication called/sent to requested pharmacy  

## 2015-11-18 NOTE — Telephone Encounter (Signed)
Wife called in requesting a refill on patient's zoloft called into [facility] on the [address] of [address]  CB# [phone]

## 2016-01-15 DIAGNOSIS — N184 Chronic kidney disease, stage 4 (severe): Secondary | ICD-10-CM | POA: Diagnosis not present

## 2016-01-15 DIAGNOSIS — N2581 Secondary hyperparathyroidism of renal origin: Secondary | ICD-10-CM | POA: Diagnosis not present

## 2016-01-15 DIAGNOSIS — D631 Anemia in chronic kidney disease: Secondary | ICD-10-CM | POA: Diagnosis not present

## 2016-01-15 DIAGNOSIS — R809 Proteinuria, unspecified: Secondary | ICD-10-CM | POA: Diagnosis not present

## 2016-01-15 DIAGNOSIS — E785 Hyperlipidemia, unspecified: Secondary | ICD-10-CM | POA: Diagnosis not present

## 2016-01-15 DIAGNOSIS — N189 Chronic kidney disease, unspecified: Secondary | ICD-10-CM | POA: Diagnosis not present

## 2016-02-10 ENCOUNTER — Ambulatory Visit (INDEPENDENT_AMBULATORY_CARE_PROVIDER_SITE_OTHER): Payer: PPO | Admitting: Family Medicine

## 2016-02-10 VITALS — BP 156/92 | HR 78 | Temp 98.2°F | Resp 14 | Ht 66.5 in | Wt 136.0 lb

## 2016-02-10 DIAGNOSIS — J019 Acute sinusitis, unspecified: Secondary | ICD-10-CM | POA: Diagnosis not present

## 2016-02-10 MED ORDER — MOMETASONE FUROATE 0.1 % EX OINT: TOPICAL_OINTMENT | Freq: Every day | CUTANEOUS | 0 refills | Status: DC

## 2016-02-10 MED ORDER — AMOXICILLIN-POT CLAVULANATE 875-125 MG PO TABS: 1.0000 | ORAL_TABLET | Freq: Two times a day (BID) | ORAL | 0 refills | Status: DC

## 2016-02-10 MED ORDER — FLUTICASONE PROPIONATE 50 MCG/ACT NA SUSP: 2.0000 | Freq: Every day | NASAL | 6 refills | Status: DC

## 2016-02-10 NOTE — Progress Notes (Signed)
   Subjective:    Patient ID: [name] [name] A Mcelveen, male    DOB: [birth year], 82 y.o.   MRN: [id number]  HPI Patient has had 3 weeks of sinus pressure in his right frontal and maxillary sinus. He now has a headache in that area. The sinuses will not drain. He reports congestion. He denies any rhinorrhea. He denies any fever. He denies any pain in his jaw. He denies any cough. It is a pressure-like pain above and behind his right eye. He does have swollen nasal mucosa bilaterally. He has a septal deviation virtually occluding his right nostril. Past Medical History:  Diagnosis Date  . AAA (abdominal aortic aneurysm) (HCC)   . Benign hypertension with CKD (chronic kidney disease) stage IV (HCC)   . HTN (hypertension)   . Hyperlipidemia    No past surgical history on file. Current Outpatient Prescriptions on File Prior to Visit  Medication Sig Dispense Refill  . amLODipine (NORVASC) 10 MG tablet Take 1 tablet (10 mg total) by mouth daily. 90 tablet 3  . pravastatin (PRAVACHOL) 80 MG tablet TAKE 1 TABLET BY MOUTH DAILY AT 6 PM 90 tablet 1  . sertraline (ZOLOFT) 50 MG tablet Take 1 tablet (50 mg total) by mouth 3 (three) times daily. 270 tablet 3  . sodium bicarbonate 650 MG tablet Take 2 tablets (1,300 mg total) by mouth 2 (two) times daily. 360 tablet 3   No current facility-administered medications on file prior to visit.    No Known Allergies Social History   Social History  . Marital status: Married    Spouse name: N/A  . Number of children: N/A  . Years of education: N/A   Occupational History  . Not on file.   Social History Main Topics  . Smoking status: Former Smoker    Types: Cigarettes  . Smokeless tobacco: Former Neurosurgeon    Quit date: 03/09/1988  . Alcohol use No  . Drug use: No  . Sexual activity: Yes   Other Topics Concern  . Not on file   Social History Narrative  . No narrative on file      Review of Systems  All other systems reviewed and are negative.        Objective:   Physical Exam  HENT:  Right Ear: Tympanic membrane, external ear and ear canal normal.  Left Ear: Tympanic membrane, external ear and ear canal normal.  Nose: Mucosal edema and rhinorrhea present. Right sinus exhibits maxillary sinus tenderness and frontal sinus tenderness.  Mouth/Throat: Oropharynx is clear and moist.  Neck: Neck supple.  Cardiovascular: Normal rate, regular rhythm and normal heart sounds.   Pulmonary/Chest: Effort normal and breath sounds normal. No respiratory distress. He has no wheezes.  Lymphadenopathy:    He has no cervical adenopathy.  Vitals reviewed.         Assessment & Plan:  Acute rhinosinusitis - Plan: amoxicillin-clavulanate (AUGMENTIN) 875-125 MG tablet, fluticasone (FLONASE) 50 MCG/ACT nasal spray  Begin Augmentin 875 mg by mouth twice a day for 10 days. Use Flonase 2 sprays each nostril daily. Recheck in one week if no better or sooner if worse

## 2016-04-29 DIAGNOSIS — J029 Acute pharyngitis, unspecified: Secondary | ICD-10-CM | POA: Diagnosis not present

## 2016-04-29 DIAGNOSIS — J209 Acute bronchitis, unspecified: Secondary | ICD-10-CM | POA: Diagnosis not present

## 2016-05-06 ENCOUNTER — Ambulatory Visit
Admission: RE | Admit: 2016-05-06 | Discharge: 2016-05-06 | Disposition: A | Discharge status: Home / Self Care | Payer: PPO | Source: Ambulatory Visit | Attending: Family Medicine | Admitting: Family Medicine

## 2016-05-06 ENCOUNTER — Ambulatory Visit (INDEPENDENT_AMBULATORY_CARE_PROVIDER_SITE_OTHER): Payer: PPO | Admitting: Family Medicine

## 2016-05-06 ENCOUNTER — Encounter: Payer: Self-pay | Admitting: Family Medicine

## 2016-05-06 VITALS — BP 142/94 | HR 86 | Temp 97.7°F | Resp 18 | Ht 66.5 in | Wt 134.0 lb

## 2016-05-06 DIAGNOSIS — R609 Edema, unspecified: Secondary | ICD-10-CM | POA: Diagnosis not present

## 2016-05-06 DIAGNOSIS — R059 Cough, unspecified: Secondary | ICD-10-CM

## 2016-05-06 DIAGNOSIS — I10 Essential (primary) hypertension: Secondary | ICD-10-CM

## 2016-05-06 DIAGNOSIS — R05 Cough: Secondary | ICD-10-CM | POA: Diagnosis not present

## 2016-05-06 DIAGNOSIS — J40 Bronchitis, not specified as acute or chronic: Secondary | ICD-10-CM | POA: Diagnosis not present

## 2016-05-06 LAB — COMPREHENSIVE METABOLIC PANEL
ALT: 7 U/L — ABNORMAL LOW (ref 9–46)
AST: 13 U/L (ref 10–35)
Albumin: 3.6 g/dL (ref 3.6–5.1)
Alkaline Phosphatase: 95 U/L (ref 40–115)
BUN: 17 mg/dL (ref 7–25)
CHLORIDE: 99 mmol/L (ref 98–110)
CO2: 24 mmol/L (ref 20–31)
CREATININE: 2.03 mg/dL — AB (ref 0.70–1.11)
Calcium: 8.9 mg/dL (ref 8.6–10.3)
Glucose, Bld: 107 mg/dL — ABNORMAL HIGH (ref 70–99)
Potassium: 4.1 mmol/L (ref 3.5–5.3)
Sodium: 138 mmol/L (ref 135–146)
Total Bilirubin: 0.6 mg/dL (ref 0.2–1.2)
Total Protein: 6.8 g/dL (ref 6.1–8.1)

## 2016-05-06 MED ORDER — FUROSEMIDE 20 MG PO TABS: 20.0000 mg | ORAL_TABLET | Freq: Every day | ORAL | 0 refills | Status: DC

## 2016-05-06 MED ORDER — ALBUTEROL SULFATE HFA 108 (90 BASE) MCG/ACT IN AERS: 2.0000 | INHALATION_SPRAY | Freq: Four times a day (QID) | RESPIRATORY_TRACT | 0 refills | Status: DC | PRN

## 2016-05-06 NOTE — Assessment & Plan Note (Signed)
Bp a little elevated in setting of fluid retention. I thank his peripheral edema is likely due to more dependent edema sleeping in the chair as possible is due to amlodipine possible he has some heart failure at his age and with his other comorbidities. I'll recheck his labs as well as a BNP will obtain a chest x-ray. We'll plan to give him Lasix 20 mg once a day for the next 3 days and have him follow-up on Monday.

## 2016-05-06 NOTE — Progress Notes (Signed)
Subjective:    Patient ID: [name] [name] A [place]field, male    DOB: [birth year], 83 y.o.   MRN: [id number]  Patient presents for Feet and ankles swelling   Pt here with swelling of his feet and ankles   History reviewed has thoracic AA no history of heart failure , hypertension with CKD stage 4 , currently on Norvasc 10mg  once a day subclinical hypothyroidism/ secondary hyperparathyrodism  also noted on chart  Last metabolic panel was fromNov  Sept 2017  Cr was 2.02 BUN 31  Hb 14   He is declined intervention on his thoracic aneurysm Of his age and he would have to be on hemodialysis which she does not want to do.  Weight was 136lbs in Dec 2017    Has had cough, congestion wheezing felt like he had the flu , seen by his wife's doctor Burton Apley, given Keflex 250mg  QID for 10 days last week and a shot ? - has 3 days left. Feels better, still has some wheezing    States he had been sittingup in chair to sleep and could not lie backwards due to illness, fliud started gaining in his legs. Typically has some swelling but it goes down every night, this has not been going down.  WIth regards to weight since his wife broke her hip, he has not been eating as well, more snack foods and easy things he can fix, he knows he has lost weight since our last visit     Review Of Systems:  GEN- denies fatigue, fever, weight loss,weakness, recent illness HEENT- denies eye drainage, change in vision, nasal discharge, CVS- denies chest pain, palpitations RESP- denies SOB, cough, wheeze ABD- denies N/V, change in stools, abd pain GU- denies dysuria, hematuria, dribbling, incontinence MSK- denies joint pain, muscle aches, injury Neuro- denies headache, dizziness, syncope, seizure activity       Objective:    BP (!) 142/94   Pulse 86   Temp 97.7 F (36.5 C) (Oral)   Resp 18   Ht 5' 6.5" (1.689 m)   Wt 134 lb (60.8 kg)   BMI 21.30 kg/m  GEN- NAD, alert and oriented x3 HEENT- PERRL, EOMI, non injected  sclera, pink conjunctiva, MMM, oropharynx clear Neck- Supple, no JVD  CVS- RRR, no murmur RESP-wheezing bilat, no rhonchi, no rales, normal WOB  ABD-NABS,soft,NT,ND EXT- 1+ pitting edema Pulses- Radial, DP- 2+        Assessment & Plan:      Problem List Items Addressed This Visit    HTN (hypertension)    Bp a little elevated in setting of fluid retention. I thank his peripheral edema is likely due to more dependent edema sleeping in the chair as possible is due to amlodipine possible he has some heart failure at his age and with his other comorbidities. I'll recheck his labs as well as a BNP will obtain a chest x-ray. We'll plan to give him Lasix 20 mg once a day for the next 3 days and have him follow-up on Monday.       Other Visit Diagnoses    Peripheral edema    -  Primary   elevage legs   Relevant Orders   Comprehensive metabolic panel   Brain natriuretic peptide   Bronchitis       Recent infection, CXR obtain, no PNA, D/C the keflex unsure why he was given this for "Flu" and we do not have any provider notes. Given albuterol inhaler. Can use robitussin as  well.   Xray no PNA, no fluid, Changes of COPD.   Relevant Orders   DG Chest 2 View (Completed)      Note: This dictation was prepared with Dragon dictation along with smaller phrase technology. Any transcriptional errors that result from this process are unintentional.

## 2016-05-06 NOTE — Patient Instructions (Signed)
[facility]  [address] [place] , [address]  Plan to give you Lasix, take 1 water pill once a day for 3 days  Elevate your feet   F/U pending results

## 2016-05-07 LAB — BRAIN NATRIURETIC PEPTIDE: Brain Natriuretic Peptide: 161.3 pg/mL — ABNORMAL HIGH (ref <100)

## 2016-05-11 ENCOUNTER — Other Ambulatory Visit: Payer: Self-pay | Admitting: Family Medicine

## 2016-05-11 ENCOUNTER — Encounter: Payer: Self-pay | Admitting: Family Medicine

## 2016-05-11 ENCOUNTER — Ambulatory Visit (INDEPENDENT_AMBULATORY_CARE_PROVIDER_SITE_OTHER): Payer: PPO | Admitting: Family Medicine

## 2016-05-11 VITALS — BP 140/88 | HR 76 | Temp 97.5°F | Resp 18 | Wt 128.0 lb

## 2016-05-11 DIAGNOSIS — I129 Hypertensive chronic kidney disease with stage 1 through stage 4 chronic kidney disease, or unspecified chronic kidney disease: Secondary | ICD-10-CM

## 2016-05-11 DIAGNOSIS — E7800 Pure hypercholesterolemia, unspecified: Secondary | ICD-10-CM

## 2016-05-11 DIAGNOSIS — N184 Chronic kidney disease, stage 4 (severe): Secondary | ICD-10-CM | POA: Diagnosis not present

## 2016-05-11 DIAGNOSIS — I10 Essential (primary) hypertension: Secondary | ICD-10-CM

## 2016-05-11 DIAGNOSIS — E039 Hypothyroidism, unspecified: Secondary | ICD-10-CM | POA: Diagnosis not present

## 2016-05-11 DIAGNOSIS — R0609 Other forms of dyspnea: Secondary | ICD-10-CM | POA: Diagnosis not present

## 2016-05-11 DIAGNOSIS — E038 Other specified hypothyroidism: Secondary | ICD-10-CM

## 2016-05-11 MED ORDER — SODIUM BICARBONATE 650 MG PO TABS: 650.0000 mg | ORAL_TABLET | Freq: Two times a day (BID) | ORAL | 3 refills | Status: DC

## 2016-05-11 MED ORDER — SERTRALINE HCL 50 MG PO TABS: 100.0000 mg | ORAL_TABLET | Freq: Every day | ORAL | 3 refills | Status: DC

## 2016-05-11 NOTE — Progress Notes (Signed)
Subjective:    Patient ID: [name] [name], male    DOB: 1929-09-03, 83 y.o.   MRN: [id number]  HPI Was in last week by my partner for swelling in his legs. Was given a 3 day course of Lasix which improved the swelling. Chest x-ray was obtained which showed COPD but no evidence of CHF. Chest x-ray didn't support possible COPD and the patient states that he has a history of asthma as a child. He is not currently using any type of bronchodilator. He does report dyspnea on exertion. He has been sleeping upright in a chair due to orthopnea and coughing. He denies any chest pain or applications or syncope Past Medical History:  Diagnosis Date  . AAA (abdominal aortic aneurysm) (HCC)   . Benign hypertension with CKD (chronic kidney disease) stage IV (HCC)   . HTN (hypertension)   . Hyperlipidemia   . Hyperparathyroidism (HCC)   . Thyroid disease    No past surgical history on file. Current Outpatient Prescriptions on File Prior to Visit  Medication Sig Dispense Refill  . albuterol (PROVENTIL HFA;VENTOLIN HFA) 108 (90 Base) MCG/ACT inhaler Inhale 2 puffs into the lungs every 6 (six) hours as needed for wheezing or shortness of breath. 1 Inhaler 0  . amLODipine (NORVASC) 10 MG tablet Take 1 tablet (10 mg total) by mouth daily. 90 tablet 3  . furosemide (LASIX) 20 MG tablet Take 1 tablet (20 mg total) by mouth daily. 7 tablet 0  . pravastatin (PRAVACHOL) 80 MG tablet TAKE 1 TABLET BY MOUTH DAILY AT 6 PM 90 tablet 1  . sertraline (ZOLOFT) 50 MG tablet Take 1 tablet (50 mg total) by mouth 3 (three) times daily. 270 tablet 3  . sodium bicarbonate 650 MG tablet Take 2 tablets (1,300 mg total) by mouth 2 (two) times daily. 360 tablet 3   No current facility-administered medications on file prior to visit.    No Known Allergies Social History   Social History  . Marital status: Married    Spouse name: N/A  . Number of children: N/A  . Years of education: N/A   Occupational History  . Not on  file.   Social History Main Topics  . Smoking status: Former Smoker    Types: Cigarettes  . Smokeless tobacco: Former Neurosurgeon    Quit date: 03/09/1988  . Alcohol use No  . Drug use: No  . Sexual activity: Yes   Other Topics Concern  . Not on file   Social History Narrative  . No narrative on file   No family history on file.   Review of Systems  All other systems reviewed and are negative.      Objective:   Physical Exam  Constitutional: He is oriented to person, place, and time. He appears well-developed and well-nourished. No distress.  HENT:  Head: Normocephalic and atraumatic.  Right Ear: External ear normal.  Left Ear: External ear normal.  Nose: Nose normal.  Mouth/Throat: Oropharynx is clear and moist. No oropharyngeal exudate.  Eyes: Conjunctivae and EOM are normal. Pupils are equal, round, and reactive to light. Right eye exhibits no discharge. Left eye exhibits no discharge. No scleral icterus.  Neck: Normal range of motion. Neck supple. No JVD present. No tracheal deviation present. No thyromegaly present.  Cardiovascular: Normal rate, regular rhythm, normal heart sounds and intact distal pulses.  Exam reveals no gallop and no friction rub.   No murmur heard. Pulmonary/Chest: Effort normal and breath sounds normal. No stridor.  No respiratory distress. He has no wheezes. He has no rales. He exhibits no tenderness.  Abdominal: Soft. Bowel sounds are normal. He exhibits no distension and no mass. There is no tenderness. There is no rebound and no guarding.  Musculoskeletal: Normal range of motion. He exhibits no edema or tenderness.  Lymphadenopathy:    He has no cervical adenopathy.  Neurological: He is alert and oriented to person, place, and time. He has normal reflexes. No cranial nerve deficit. He exhibits normal muscle tone. Coordination normal.  Skin: Skin is warm. No rash noted. He is not diaphoretic. No erythema. No pallor.  Psychiatric: He has a normal mood  and affect. His behavior is normal. Judgment and thought content normal.  Vitals reviewed.         Assessment & Plan:  Hypertension, unspecified type - Plan: CBC with Differential/Platelet, COMPLETE METABOLIC PANEL WITH GFR, Lipid panel  Subclinical hypothyroidism - Plan: TSH  Pure hypercholesterolemia - Plan: CBC with Differential/Platelet, COMPLETE METABOLIC PANEL WITH GFR, Lipid panel  Benign hypertension with CKD (chronic kidney disease) stage IV (HCC) Symptomatically, the patient is better. His blood pressure is controlled. While he is here, I will check a CMP, lipid panel, TSH, CBC. I will also schedule the patient for an echocardiogram to evaluate for congestive heart failure in the fact his BNP was slightly elevated and because of his symptoms. If his echocardiogram is normal, I would consider starting the patient on Symbicort to try to improve his dyspnea on exertion.

## 2016-05-12 ENCOUNTER — Other Ambulatory Visit: Payer: PPO

## 2016-05-12 DIAGNOSIS — I10 Essential (primary) hypertension: Secondary | ICD-10-CM | POA: Diagnosis not present

## 2016-05-12 DIAGNOSIS — I129 Hypertensive chronic kidney disease with stage 1 through stage 4 chronic kidney disease, or unspecified chronic kidney disease: Secondary | ICD-10-CM

## 2016-05-12 DIAGNOSIS — E785 Hyperlipidemia, unspecified: Secondary | ICD-10-CM

## 2016-05-12 DIAGNOSIS — N184 Chronic kidney disease, stage 4 (severe): Secondary | ICD-10-CM | POA: Diagnosis not present

## 2016-05-12 DIAGNOSIS — Z79899 Other long term (current) drug therapy: Secondary | ICD-10-CM

## 2016-05-12 LAB — CBC WITH DIFFERENTIAL/PLATELET
BASOS ABS: 0 {cells}/uL (ref 0–200)
BASOS PCT: 0 %
EOS ABS: 207 {cells}/uL (ref 15–500)
EOS PCT: 3 %
HCT: 38.9 % (ref 38.5–50.0)
Hemoglobin: 12.6 g/dL — ABNORMAL LOW (ref 13.0–17.0)
LYMPHS PCT: 15 %
Lymphs Abs: 1035 cells/uL (ref 850–3900)
MCH: 30.4 pg (ref 27.0–33.0)
MCHC: 32.4 g/dL (ref 32.0–36.0)
MCV: 93.7 fL (ref 80.0–100.0)
MONOS PCT: 7 %
MPV: 9.6 fL (ref 7.5–12.5)
Monocytes Absolute: 483 cells/uL (ref 200–950)
NEUTROS ABS: 5175 {cells}/uL (ref 1500–7800)
Neutrophils Relative %: 75 %
Platelets: 200 10*3/uL (ref 140–400)
RBC: 4.15 MIL/uL — ABNORMAL LOW (ref 4.20–5.80)
RDW: 14.1 % (ref 11.0–15.0)
WBC: 6.9 10*3/uL (ref 3.8–10.8)

## 2016-05-12 LAB — COMPLETE METABOLIC PANEL WITH GFR
ALT: 6 U/L — AB (ref 9–46)
AST: 13 U/L (ref 10–35)
Albumin: 3.6 g/dL (ref 3.6–5.1)
Alkaline Phosphatase: 106 U/L (ref 40–115)
BUN: 18 mg/dL (ref 7–25)
CALCIUM: 8.9 mg/dL (ref 8.6–10.3)
CHLORIDE: 97 mmol/L — AB (ref 98–110)
CO2: 26 mmol/L (ref 20–31)
Creat: 2.13 mg/dL — ABNORMAL HIGH (ref 0.70–1.11)
GFR, Est African American: 32 mL/min — ABNORMAL LOW (ref >=60)
GFR, Est Non African American: 28 mL/min — ABNORMAL LOW (ref >=60)
Glucose, Bld: 114 mg/dL — ABNORMAL HIGH (ref 70–99)
POTASSIUM: 4.1 mmol/L (ref 3.5–5.3)
SODIUM: 136 mmol/L (ref 135–146)
Total Bilirubin: 0.6 mg/dL (ref 0.2–1.2)
Total Protein: 6.9 g/dL (ref 6.1–8.1)

## 2016-05-12 LAB — LIPID PANEL
CHOL/HDL RATIO: 2.9 ratio (ref <5.0)
CHOLESTEROL: 120 mg/dL (ref <200)
HDL: 41 mg/dL (ref >40)
LDL CALC: 45 mg/dL (ref <100)
Triglycerides: 172 mg/dL — ABNORMAL HIGH (ref <150)
VLDL: 34 mg/dL — AB (ref <30)

## 2016-05-26 ENCOUNTER — Encounter: Payer: Self-pay | Admitting: Family Medicine

## 2016-05-26 ENCOUNTER — Ambulatory Visit (HOSPITAL_COMMUNITY): Payer: PPO | Attending: Cardiology

## 2016-05-26 ENCOUNTER — Other Ambulatory Visit: Payer: Self-pay

## 2016-05-26 DIAGNOSIS — R0609 Other forms of dyspnea: Secondary | ICD-10-CM

## 2016-05-26 DIAGNOSIS — I351 Nonrheumatic aortic (valve) insufficiency: Secondary | ICD-10-CM | POA: Insufficient documentation

## 2016-05-26 DIAGNOSIS — I501 Left ventricular failure: Secondary | ICD-10-CM | POA: Diagnosis not present

## 2016-05-26 DIAGNOSIS — I422 Other hypertrophic cardiomyopathy: Secondary | ICD-10-CM | POA: Insufficient documentation

## 2016-05-26 DIAGNOSIS — I5189 Other ill-defined heart diseases: Secondary | ICD-10-CM | POA: Insufficient documentation

## 2016-05-26 DIAGNOSIS — I340 Nonrheumatic mitral (valve) insufficiency: Secondary | ICD-10-CM | POA: Diagnosis not present

## 2016-05-26 DIAGNOSIS — I361 Nonrheumatic tricuspid (valve) insufficiency: Secondary | ICD-10-CM | POA: Insufficient documentation

## 2016-05-27 ENCOUNTER — Other Ambulatory Visit: Payer: Self-pay | Admitting: Family Medicine

## 2016-05-27 DIAGNOSIS — I129 Hypertensive chronic kidney disease with stage 1 through stage 4 chronic kidney disease, or unspecified chronic kidney disease: Secondary | ICD-10-CM

## 2016-05-27 DIAGNOSIS — N184 Chronic kidney disease, stage 4 (severe): Secondary | ICD-10-CM

## 2016-05-27 DIAGNOSIS — I5189 Other ill-defined heart diseases: Secondary | ICD-10-CM

## 2016-06-02 ENCOUNTER — Other Ambulatory Visit: Payer: PPO

## 2016-06-02 DIAGNOSIS — N184 Chronic kidney disease, stage 4 (severe): Secondary | ICD-10-CM | POA: Diagnosis not present

## 2016-06-02 DIAGNOSIS — I129 Hypertensive chronic kidney disease with stage 1 through stage 4 chronic kidney disease, or unspecified chronic kidney disease: Secondary | ICD-10-CM

## 2016-06-02 DIAGNOSIS — I519 Heart disease, unspecified: Secondary | ICD-10-CM | POA: Diagnosis not present

## 2016-06-02 DIAGNOSIS — I5189 Other ill-defined heart diseases: Secondary | ICD-10-CM

## 2016-06-02 LAB — BASIC METABOLIC PANEL
BUN: 23 mg/dL (ref 7–25)
CALCIUM: 8.9 mg/dL (ref 8.6–10.3)
CHLORIDE: 98 mmol/L (ref 98–110)
CO2: 26 mmol/L (ref 20–31)
Creat: 2.14 mg/dL — ABNORMAL HIGH (ref 0.70–1.11)
GLUCOSE: 94 mg/dL (ref 70–99)
Potassium: 4.4 mmol/L (ref 3.5–5.3)
Sodium: 135 mmol/L (ref 135–146)

## 2016-07-07 DIAGNOSIS — N184 Chronic kidney disease, stage 4 (severe): Secondary | ICD-10-CM | POA: Diagnosis not present

## 2016-07-07 DIAGNOSIS — N2581 Secondary hyperparathyroidism of renal origin: Secondary | ICD-10-CM | POA: Diagnosis not present

## 2016-07-07 DIAGNOSIS — E785 Hyperlipidemia, unspecified: Secondary | ICD-10-CM | POA: Diagnosis not present

## 2016-07-07 DIAGNOSIS — R809 Proteinuria, unspecified: Secondary | ICD-10-CM | POA: Diagnosis not present

## 2016-07-07 DIAGNOSIS — D631 Anemia in chronic kidney disease: Secondary | ICD-10-CM | POA: Diagnosis not present

## 2016-08-09 ENCOUNTER — Other Ambulatory Visit: Payer: Self-pay | Admitting: Family Medicine

## 2016-08-12 DIAGNOSIS — D631 Anemia in chronic kidney disease: Secondary | ICD-10-CM | POA: Diagnosis not present

## 2016-08-12 DIAGNOSIS — N2581 Secondary hyperparathyroidism of renal origin: Secondary | ICD-10-CM | POA: Diagnosis not present

## 2016-08-12 DIAGNOSIS — E785 Hyperlipidemia, unspecified: Secondary | ICD-10-CM | POA: Diagnosis not present

## 2016-08-12 DIAGNOSIS — R809 Proteinuria, unspecified: Secondary | ICD-10-CM | POA: Diagnosis not present

## 2016-08-12 DIAGNOSIS — N184 Chronic kidney disease, stage 4 (severe): Secondary | ICD-10-CM | POA: Diagnosis not present

## 2016-08-14 ENCOUNTER — Encounter: Payer: Self-pay | Admitting: Nephrology

## 2016-08-25 ENCOUNTER — Ambulatory Visit (INDEPENDENT_AMBULATORY_CARE_PROVIDER_SITE_OTHER): Payer: PPO | Admitting: Family Medicine

## 2016-08-25 ENCOUNTER — Encounter: Payer: Self-pay | Admitting: Family Medicine

## 2016-08-25 VITALS — BP 138/82 | HR 76 | Temp 97.8°F | Resp 14 | Ht 66.5 in | Wt 128.0 lb

## 2016-08-25 DIAGNOSIS — M7021 Olecranon bursitis, right elbow: Secondary | ICD-10-CM | POA: Diagnosis not present

## 2016-08-25 DIAGNOSIS — L03113 Cellulitis of right upper limb: Secondary | ICD-10-CM

## 2016-08-25 MED ORDER — CEPHALEXIN 500 MG PO CAPS: 500.0000 mg | ORAL_CAPSULE | Freq: Four times a day (QID) | ORAL | 0 refills | Status: DC

## 2016-08-25 NOTE — Progress Notes (Signed)
Subjective:    Patient ID: [name] [name], male    DOB: Nov 18, 1929, 83 y.o.   MRN: [id number]  HPI  More than a week ago, the patient struck his right elbow against a door frame. Shortly thereafter, his elbow became red hot and swollen. The erythema is now extending over his dorsal right forearm. The skin and forearm is visibly swollen. He also has obvious olecranon bursitis. The bursa is swollen with fluid. However the bursa is not exquisitely tender to palpation making me think septic bursitis is unlikely. I believe he has traumatic bursitis in the elbow but possible cellulitis in the skin overlying the elbow as well as in the dorsal forearm Past Medical History:  Diagnosis Date  . AAA (abdominal aortic aneurysm) (HCC)   . Benign hypertension with CKD (chronic kidney disease) stage IV (HCC)   . Diastolic dysfunction   . HTN (hypertension)   . Hyperlipidemia   . Hyperparathyroidism (HCC)   . Thyroid disease    No past surgical history on file. Current Outpatient Prescriptions on File Prior to Visit  Medication Sig Dispense Refill  . albuterol (PROVENTIL HFA;VENTOLIN HFA) 108 (90 Base) MCG/ACT inhaler Inhale 2 puffs into the lungs every 6 (six) hours as needed for wheezing or shortness of breath. 1 Inhaler 0  . amLODipine (NORVASC) 10 MG tablet TAKE 1 TABLET(10 MG) BY MOUTH DAILY 90 tablet 3  . pravastatin (PRAVACHOL) 80 MG tablet TAKE 1 TABLET BY MOUTH DAILY AT 6 PM 90 tablet 3  . sertraline (ZOLOFT) 50 MG tablet Take 2 tablets (100 mg total) by mouth daily. 180 tablet 3  . sodium bicarbonate 650 MG tablet Take 1 tablet (650 mg total) by mouth 2 (two) times daily. 180 tablet 3   No current facility-administered medications on file prior to visit.    No Known Allergies Social History   Social History  . Marital status: Married    Spouse name: N/A  . Number of children: N/A  . Years of education: N/A   Occupational History  . Not on file.   Social History Main Topics  .  Smoking status: Former Smoker    Types: Cigarettes  . Smokeless tobacco: Former Neurosurgeon    Quit date: 03/09/1988  . Alcohol use No  . Drug use: No  . Sexual activity: Yes   Other Topics Concern  . Not on file   Social History Narrative  . No narrative on file     Review of Systems  All other systems reviewed and are negative.      Objective:   Physical Exam  Cardiovascular: Normal rate, regular rhythm and normal heart sounds.   Pulmonary/Chest: Effort normal and breath sounds normal. No respiratory distress. He has no wheezes. He has no rales.  Musculoskeletal:       Right elbow: He exhibits swelling and deformity. Tenderness found. No olecranon process tenderness noted.       Arms: Skin: Skin is warm. There is erythema.  Vitals reviewed.   Olecranon bursitis right also      Assessment & Plan:  Cellulitis of right upper extremity - Plan: cephALEXin (KEFLEX) 500 MG capsule  Olecranon bursitis, right elbow  Treat cellulitis with Keflex 500 mg 4 times a day for 1 week. Reassess in one week. I debated aspirating the bursa. However given the redness in the skin overlying the area, I was concerned about possibly spreading infection into the bursa sac because of obvious cellulitis. Therefore I'll try to treat  the cellulitis first if the bursitis persists, I would then aspirated the bursa sac and treat with cortisone injection in the bursa sac. I do not believe the patient has septic bursitis as the bursa is not painful. The pain is actually more in the forearm where he has cellulitis. Reassess in one week or sooner if worse

## 2017-06-14 ENCOUNTER — Other Ambulatory Visit: Payer: Self-pay | Admitting: Family Medicine

## 2017-09-06 ENCOUNTER — Other Ambulatory Visit: Payer: Self-pay | Admitting: Family Medicine

## 2017-12-14 ENCOUNTER — Other Ambulatory Visit: Payer: Self-pay | Admitting: Family Medicine

## 2018-02-21 ENCOUNTER — Ambulatory Visit (INDEPENDENT_AMBULATORY_CARE_PROVIDER_SITE_OTHER): Payer: PPO | Admitting: Nurse Practitioner

## 2018-02-21 ENCOUNTER — Encounter: Payer: Self-pay | Admitting: Nurse Practitioner

## 2018-02-21 VITALS — BP 134/80 | HR 88 | Temp 97.9°F | Ht 67.0 in | Wt 123.0 lb

## 2018-02-21 DIAGNOSIS — I10 Essential (primary) hypertension: Secondary | ICD-10-CM

## 2018-02-21 DIAGNOSIS — R197 Diarrhea, unspecified: Secondary | ICD-10-CM | POA: Diagnosis not present

## 2018-02-21 DIAGNOSIS — Z23 Encounter for immunization: Secondary | ICD-10-CM | POA: Diagnosis not present

## 2018-02-21 DIAGNOSIS — F419 Anxiety disorder, unspecified: Secondary | ICD-10-CM

## 2018-02-21 DIAGNOSIS — R1907 Generalized intra-abdominal and pelvic swelling, mass and lump: Secondary | ICD-10-CM

## 2018-02-21 DIAGNOSIS — R0602 Shortness of breath: Secondary | ICD-10-CM | POA: Diagnosis not present

## 2018-02-21 DIAGNOSIS — R350 Frequency of micturition: Secondary | ICD-10-CM

## 2018-02-21 DIAGNOSIS — E039 Hypothyroidism, unspecified: Secondary | ICD-10-CM | POA: Diagnosis not present

## 2018-02-21 DIAGNOSIS — N184 Chronic kidney disease, stage 4 (severe): Secondary | ICD-10-CM

## 2018-02-21 DIAGNOSIS — E7800 Pure hypercholesterolemia, unspecified: Secondary | ICD-10-CM

## 2018-02-21 DIAGNOSIS — E038 Other specified hypothyroidism: Secondary | ICD-10-CM

## 2018-02-21 DIAGNOSIS — I5189 Other ill-defined heart diseases: Secondary | ICD-10-CM

## 2018-02-21 DIAGNOSIS — I129 Hypertensive chronic kidney disease with stage 1 through stage 4 chronic kidney disease, or unspecified chronic kidney disease: Secondary | ICD-10-CM

## 2018-02-21 LAB — TSH: TSH: 4.86 mIU/L — ABNORMAL HIGH (ref 0.40–4.50)

## 2018-02-21 NOTE — Progress Notes (Signed)
Careteam: Patient Care Team: Sharon Seller, NP as PCP - General (Geriatric Medicine)  Advanced Directive information Does Patient Have a Medical Advance Directive?: Yes, Type of Advance Directive: Healthcare Power of [place];Living will, Does patient want to make changes to medical advance directive?: No - Patient declined  No Known Allergies  Chief Complaint  Patient presents with  . Establish Care    New patient establish care. Patient c/o URI, diarrhea x 1 month and  lower leg swelling. Here with wife (hard of hearing)    . Immunizations    Discuss all recommend vaccines  . Medication Management    Patient did not have pill bottles at New Patient appointment      HPI: Patient is a 84 y.o. male seen in the office today to establish care.  Has not been to a doctor in over a year.   Needs flu shot.   Hypertension- amlodipine 10 mg daily, does not follow low sodium diet  Hyperlipidemia- on pravastatin 80 mg daily.   Diarrhea- for over a month, taking imodium but it has not helped.  Going to the bathroom 4-5 times but not a lot of stool, mostly liquid. No abdominal pain.  Not been seen for it. Never had diarrhea before.  No recent antibiotics No nausea, not bad GERD  Taking alka-seltzer for acid reflux not working well.   Has CKD- taking sdoim bicarbonate 650 mg by mouth twice daily- has nephrologist but has not been in over a year. Normally sees every 6 month.  Anxiety- taking zoloft 50 mg by mouth twice daily   Chronic low back pain- using tylenol 500 mg by mouth as needed when pain is bad.  grade 2 diastolic dysfunction noted  On echo in 2018. Pt with swelling to LE that comes and goes. States he does have to sleep propped up with pillows that has been going due to worsening shortness of breath.   Review of Systems:  Review of Systems  Constitutional: Positive for weight loss (decrease appetitie). Negative for chills and fever.  HENT: Negative for  tinnitus.        Hoarse for 1 week  Respiratory: Positive for cough and shortness of breath. Negative for sputum production.   Cardiovascular: Negative for chest pain, palpitations and leg swelling.  Gastrointestinal: Positive for diarrhea and heartburn. Negative for abdominal pain and constipation.  Genitourinary: Negative for dysuria, frequency and urgency.       Slow stream, getting up 2-3 times at night  Musculoskeletal: Positive for back pain and joint pain. Negative for falls and myalgias.  Skin: Negative.   Neurological: Negative for dizziness and headaches.  Endo/Heme/Allergies: Bruises/bleeds easily.  Psychiatric/Behavioral: Negative for depression and memory loss. The patient is nervous/anxious. The patient does not have insomnia.     Past Medical History:  Diagnosis Date  . AAA (abdominal aortic aneurysm) (HCC)   . Benign hypertension with CKD (chronic kidney disease) stage IV (HCC)   . Diastolic dysfunction   . HTN (hypertension)   . Hyperlipidemia   . Hyperparathyroidism (HCC)   . Thyroid disease    Past Surgical History:  Procedure Laterality Date  . CYSTECTOMY  2003   removed from pancrease, per patient at [facility] NP appointment    Social History:   reports that he quit smoking about 29 years ago. His smoking use included cigarettes. He has a 55.00 pack-year smoking history. He quit smokeless tobacco use about 29 years ago. He reports that he does not  drink alcohol or use drugs.  Family History  Problem Relation Age of Onset  . Heart failure Mother 90  . Other Father 102       fall  . Heart attack Brother 60  . Other Brother        Lung problems    Medications: Patient's Medications  New Prescriptions   No medications on file  Previous Medications   ACETAMINOPHEN (TYLENOL) 500 MG TABLET    Take 500 mg by mouth 2 (two) times daily.   AMLODIPINE (NORVASC) 10 MG TABLET    TAKE 1 TABLET(10 MG) BY MOUTH DAILY   LOPERAMIDE (IMODIUM) 2 MG CAPSULE    Take 2 mg by  mouth every other day.   PHENYLEPH-CPM-DM-ASPIRIN (ALKA-SELTZER PLUS COLD & COUGH PO)    Take by mouth every 4 (four) hours.   PRAVASTATIN (PRAVACHOL) 80 MG TABLET    TAKE 1 TABLET BY MOUTH DAILY AT 6 PM   SERTRALINE (ZOLOFT) 50 MG TABLET    TAKE 2 TABLETS(100 MG) BY MOUTH DAILY   SODIUM BICARBONATE 650 MG TABLET    TAKE 1 TABLET(650 MG) BY MOUTH TWICE DAILY  Modified Medications   No medications on file  Discontinued Medications   No medications on file     Physical Exam:  Vitals:   02/21/18 0851  BP: 134/80  Pulse: 88  Temp: 97.9 F (36.6 C)  TempSrc: Oral  SpO2: 92%  Weight: 123 lb (55.8 kg)  Height: 5\' 7"  (1.702 m)   Body mass index is 19.26 kg/m.  Physical Exam Constitutional:      General: He is not in acute distress.    Appearance: He is well-developed. He is not diaphoretic.  HENT:     Head: Normocephalic and atraumatic.     Nose: Nose normal.     Mouth/Throat:     Mouth: Mucous membranes are moist.     Pharynx: Oropharynx is clear. No oropharyngeal exudate.  Eyes:     Conjunctiva/sclera: Conjunctivae normal.     Pupils: Pupils are equal, round, and reactive to light.  Neck:     Musculoskeletal: Normal range of motion and neck supple.  Cardiovascular:     Rate and Rhythm: Normal rate and regular rhythm.     Heart sounds: Normal heart sounds.  Pulmonary:     Effort: Pulmonary effort is normal.     Breath sounds: Decreased breath sounds and rhonchi (throughout) present.  Abdominal:     General: Bowel sounds are normal.     Palpations: Abdomen is soft. There is mass.     Tenderness: There is abdominal tenderness.     Comments: Thin abdomen, Tender mass noted to left of midline.  Musculoskeletal:        General: No tenderness.     Right lower leg: Edema present.     Left lower leg: Edema present.     Comments: 2+  Skin:    General: Skin is warm and dry.  Neurological:     Mental Status: He is alert and oriented to person, place, and time.    Psychiatric:        Mood and Affect: Mood normal.     Labs reviewed: Basic Metabolic Panel: No results for input(s): NA, K, CL, CO2, GLUCOSE, BUN, CREATININE, CALCIUM, MG, PHOS, TSH in the last 8760 hours. Liver Function Tests: No results for input(s): AST, ALT, ALKPHOS, BILITOT, PROT, ALBUMIN in the last 8760 hours. No results for input(s): LIPASE, AMYLASE in the last 8760  hours. No results for input(s): AMMONIA in the last 8760 hours. CBC: No results for input(s): WBC, NEUTROABS, HGB, HCT, MCV, PLT in the last 8760 hours. Lipid Panel: No results for input(s): CHOL, HDL, LDLCALC, TRIG, CHOLHDL, LDLDIRECT in the last 8760 hours. TSH: No results for input(s): TSH in the last 8760 hours. A1C: No results found for: HGBA1C   Assessment/Plan 1. Urinary frequency -no pain or dysuria  - PSA  2. Diarrhea, unspecified type -ongoing for ~1 month, imodium was helping in the beginning but not currently - CBC with Differential/Platelets - COMPLETE METABOLIC PANEL WITH GFR - Amylase - Lipase -will have him bring stool samples in for c diff, O&P and culture.   3. Pure hypercholesterolemia -currently taking pravastatin 80 mg by mouth daily  - COMPLETE METABOLIC PANEL WITH GFR - Lipid Panel  4. Hypertension, unspecified type -controlled at this time  5. Generalized intra-abdominal and pelvic swelling, mass and lump -tender mass noted to left midline with diarrhea will get CT to evaluate further - CT Abdomen Pelvis W Contrast; Future  6. Shortness of breath -pt with smoking hx, reports ongoing shortness of breath and congestion. Hx of diastolic dysfunction as well. - DG Chest 2 View - Brain Natriuretic Peptide  7. Need for influenza vaccination - Flu vaccine HIGH DOSE PF (Fluzone High dose)  8. Benign hypertension with CKD (chronic kidney disease) stage IV (HCC) -blood pressure stable today -continues on sodium bicarbonate ? compliance -follow up CMP  9. Diastolic  dysfunction -grade 2 diastolic dysfunction noted on prior echo. Appears he was started on lasix in the past which he is not currently taking. LE edema. Reports this comes and goes. Will follow up blood work at this time -BNP -CMP  10. Subclinical hypothyroidism - TSH  11. Anxiety -continues on zoloft 100 mg daily   Next appt:  Janene Harvey. Biagio Borg  Dr[facility] [phone]

## 2018-02-21 NOTE — Patient Instructions (Addendum)
To get lab work today We will need stool sample- to bring back to office To get chest xray today We are placing an order for CT of the abdomen to evaluate pain  Make sure your numbers are up to date in epic

## 2018-02-22 ENCOUNTER — Other Ambulatory Visit: Payer: Self-pay | Admitting: Nurse Practitioner

## 2018-02-22 ENCOUNTER — Ambulatory Visit
Admission: RE | Admit: 2018-02-22 | Discharge: 2018-02-22 | Disposition: A | Discharge status: Home / Self Care | Payer: PPO | Source: Ambulatory Visit | Attending: Nurse Practitioner | Admitting: Nurse Practitioner

## 2018-02-22 DIAGNOSIS — R05 Cough: Secondary | ICD-10-CM | POA: Diagnosis not present

## 2018-02-22 LAB — COMPLETE METABOLIC PANEL WITH GFR
AG Ratio: 1.2 (calc) (ref 1.0–2.5)
ALT: 9 U/L (ref 9–46)
AST: 12 U/L (ref 10–35)
Albumin: 3.3 g/dL — ABNORMAL LOW (ref 3.6–5.1)
Alkaline phosphatase (APISO): 111 U/L (ref 40–115)
BILIRUBIN TOTAL: 0.7 mg/dL (ref 0.2–1.2)
BUN/Creatinine Ratio: 12 (calc) (ref 6–22)
BUN: 26 mg/dL — ABNORMAL HIGH (ref 7–25)
CALCIUM: 8.5 mg/dL — AB (ref 8.6–10.3)
CHLORIDE: 96 mmol/L — AB (ref 98–110)
CO2: 30 mmol/L (ref 20–32)
Creat: 2.17 mg/dL — ABNORMAL HIGH (ref 0.70–1.11)
GFR, EST AFRICAN AMERICAN: 31 mL/min/{1.73_m2} — AB (ref >=60)
GFR, EST NON AFRICAN AMERICAN: 27 mL/min/{1.73_m2} — AB (ref >=60)
Globulin: 2.7 g/dL (calc) (ref 1.9–3.7)
Glucose, Bld: 104 mg/dL — ABNORMAL HIGH (ref 65–99)
POTASSIUM: 3 mmol/L — AB (ref 3.5–5.3)
Sodium: 140 mmol/L (ref 135–146)
TOTAL PROTEIN: 6 g/dL — AB (ref 6.1–8.1)

## 2018-02-22 LAB — LIPID PANEL
Cholesterol: 85 (ref 0–200)
Cholesterol: 85 mg/dL (ref <200)
HDL: 40 (ref 35–70)
HDL: 40 mg/dL — ABNORMAL LOW (ref >40)
LDL Cholesterol (Calc): 26 mg/dL (calc)
LDL Cholesterol: 26
Non-HDL Cholesterol (Calc): 45 mg/dL (calc) (ref <130)
Total CHOL/HDL Ratio: 2.1 (calc) (ref <5.0)
Triglycerides: 100 (ref 40–160)
Triglycerides: 100 mg/dL (ref <150)

## 2018-02-22 LAB — BRAIN NATRIURETIC PEPTIDE: Brain Natriuretic Peptide: 437 pg/mL — ABNORMAL HIGH (ref <100)

## 2018-02-22 LAB — CBC WITH DIFFERENTIAL/PLATELET
Absolute Monocytes: 748 cells/uL (ref 200–950)
BASOS PCT: 0.3 %
Basophils Absolute: 35 cells/uL (ref 0–200)
Eosinophils Absolute: 12 cells/uL — ABNORMAL LOW (ref 15–500)
Eosinophils Relative: 0.1 %
HCT: 34.3 % — ABNORMAL LOW (ref 38.5–50.0)
Hemoglobin: 11.6 g/dL — ABNORMAL LOW (ref 13.2–17.1)
Lymphs Abs: 345 cells/uL — ABNORMAL LOW (ref 850–3900)
MCH: 31.6 pg (ref 27.0–33.0)
MCHC: 33.8 g/dL (ref 32.0–36.0)
MCV: 93.5 fL (ref 80.0–100.0)
MPV: 10.4 fL (ref 7.5–12.5)
Monocytes Relative: 6.5 %
Neutro Abs: 10362 cells/uL — ABNORMAL HIGH (ref 1500–7800)
Neutrophils Relative %: 90.1 %
PLATELETS: 115 10*3/uL — AB (ref 140–400)
RBC: 3.67 10*6/uL — AB (ref 4.20–5.80)
RDW: 14.7 % (ref 11.0–15.0)
TOTAL LYMPHOCYTE: 3 %
WBC: 11.5 10*3/uL — AB (ref 3.8–10.8)

## 2018-02-22 LAB — BASIC METABOLIC PANEL
BUN: 26 — AB (ref 4–21)
Creatinine: 2.2 — AB (ref 0.6–1.3)
Glucose: 104
Potassium: 3 — AB (ref 3.4–5.3)
Sodium: 140 (ref 137–147)

## 2018-02-22 LAB — CBC AND DIFFERENTIAL
HCT: 34 — AB (ref 41–53)
Hemoglobin: 11.6 — AB (ref 13.5–17.5)
Platelets: 115 — AB (ref 150–399)
WBC: 11.5

## 2018-02-22 LAB — PSA
PSA: 1.1
PSA: 1.1 ng/mL (ref <=4.0)

## 2018-02-22 LAB — LIPASE

## 2018-02-22 LAB — HEPATIC FUNCTION PANEL
ALT: 9 — AB (ref 10–40)
AST: 12 — AB (ref 14–40)
Alkaline Phosphatase: 111 (ref 25–125)
Bilirubin, Total: 0.7

## 2018-02-22 LAB — AMYLASE: Amylase: <10 U/L — ABNORMAL LOW (ref 21–101)

## 2018-02-22 MED ORDER — FUROSEMIDE 40 MG PO TABS: 40.0000 mg | ORAL_TABLET | Freq: Every day | ORAL | 3 refills | Status: AC

## 2018-02-22 MED ORDER — DOXYCYCLINE HYCLATE 100 MG PO TABS: 100.0000 mg | ORAL_TABLET | Freq: Two times a day (BID) | ORAL | 0 refills | Status: DC

## 2018-02-23 ENCOUNTER — Other Ambulatory Visit: Payer: Self-pay | Admitting: Nurse Practitioner

## 2018-02-23 ENCOUNTER — Encounter: Payer: Self-pay | Admitting: Nurse Practitioner

## 2018-02-23 ENCOUNTER — Telehealth: Payer: Self-pay

## 2018-02-23 DIAGNOSIS — R197 Diarrhea, unspecified: Secondary | ICD-10-CM

## 2018-02-23 MED ORDER — MOXIFLOXACIN HCL 400 MG PO TABS: 400.0000 mg | ORAL_TABLET | Freq: Every day | ORAL | 0 refills | Status: AC

## 2018-02-23 MED ORDER — POTASSIUM CHLORIDE CRYS ER 20 MEQ PO TBCR: 20.0000 meq | EXTENDED_RELEASE_TABLET | Freq: Every day | ORAL | 0 refills | Status: AC

## 2018-02-23 NOTE — Telephone Encounter (Signed)
-----   Message from Sharon Seller, NP sent at 02/22/2018  4:57 PM EST ----- Noted fluid on lungs and elevated lab level,  Cardiomegaly (larger than normal heart noted)  lets have him start lasix 40 mg by mouth daily  Also to start doxycycline 100 mg by mouth twice daily for 1 week due to elevated WBC and infiltrates noted on xray  Lets have him follow up in 1 week as well

## 2018-02-23 NOTE — Telephone Encounter (Signed)
Discussed results with patients son [name] [name]). Son verbalized understanding. Rxs have been sent to patient's pharmacy by the provider. Appointment scheduled for 1 week follow up.   Also discussed lab results below  Slightly elevated wbc, let actually not do doxycycline (due to elevated wbc and left shift)  Stop DOXYCYCLINE Start avelox 400 mg by mouth daily for 7 days  Continue lasix 40 mg by mouth daily also will ADD potassium 20 meq by mouth daily because potassium is low  He is also with low protein and albumin indicating low nutritional status- to add ensure or protein supplement daily Lets have him follow up next Monday morning in office  Patient son questioned if his dad needed kit to collect stool sample. Kit was placed in front for pick up. Orders pending for provider to review and sign.

## 2018-02-24 ENCOUNTER — Other Ambulatory Visit: Payer: Self-pay

## 2018-02-24 ENCOUNTER — Other Ambulatory Visit: Payer: Self-pay | Admitting: Nurse Practitioner

## 2018-02-24 DIAGNOSIS — R197 Diarrhea, unspecified: Secondary | ICD-10-CM

## 2018-02-24 DIAGNOSIS — R9389 Abnormal findings on diagnostic imaging of other specified body structures: Secondary | ICD-10-CM

## 2018-02-27 ENCOUNTER — Encounter (HOSPITAL_COMMUNITY): Payer: Self-pay | Admitting: Emergency Medicine

## 2018-02-27 ENCOUNTER — Emergency Department (HOSPITAL_COMMUNITY): Payer: PPO

## 2018-02-27 ENCOUNTER — Inpatient Hospital Stay (HOSPITAL_COMMUNITY)
Admission: EM | Admit: 2018-02-27 | Discharge: 2018-02-28 | DRG: 300 | Disposition: A | Discharge status: Hospice | Payer: PPO | Attending: Internal Medicine | Admitting: Internal Medicine

## 2018-02-27 DIAGNOSIS — E785 Hyperlipidemia, unspecified: Secondary | ICD-10-CM | POA: Diagnosis not present

## 2018-02-27 DIAGNOSIS — I5032 Chronic diastolic (congestive) heart failure: Secondary | ICD-10-CM | POA: Diagnosis present

## 2018-02-27 DIAGNOSIS — R52 Pain, unspecified: Secondary | ICD-10-CM | POA: Diagnosis not present

## 2018-02-27 DIAGNOSIS — N184 Chronic kidney disease, stage 4 (severe): Secondary | ICD-10-CM | POA: Diagnosis not present

## 2018-02-27 DIAGNOSIS — Z79899 Other long term (current) drug therapy: Secondary | ICD-10-CM

## 2018-02-27 DIAGNOSIS — Z66 Do not resuscitate: Secondary | ICD-10-CM | POA: Diagnosis not present

## 2018-02-27 DIAGNOSIS — R17 Unspecified jaundice: Secondary | ICD-10-CM | POA: Diagnosis not present

## 2018-02-27 DIAGNOSIS — N179 Acute kidney failure, unspecified: Secondary | ICD-10-CM | POA: Diagnosis not present

## 2018-02-27 DIAGNOSIS — N289 Disorder of kidney and ureter, unspecified: Secondary | ICD-10-CM

## 2018-02-27 DIAGNOSIS — I714 Abdominal aortic aneurysm, without rupture, unspecified: Secondary | ICD-10-CM

## 2018-02-27 DIAGNOSIS — E876 Hypokalemia: Secondary | ICD-10-CM | POA: Diagnosis not present

## 2018-02-27 DIAGNOSIS — M545 Low back pain, unspecified: Secondary | ICD-10-CM

## 2018-02-27 DIAGNOSIS — E871 Hypo-osmolality and hyponatremia: Secondary | ICD-10-CM

## 2018-02-27 DIAGNOSIS — I10 Essential (primary) hypertension: Secondary | ICD-10-CM | POA: Diagnosis not present

## 2018-02-27 DIAGNOSIS — I130 Hypertensive heart and chronic kidney disease with heart failure and stage 1 through stage 4 chronic kidney disease, or unspecified chronic kidney disease: Secondary | ICD-10-CM | POA: Diagnosis not present

## 2018-02-27 DIAGNOSIS — Z7189 Other specified counseling: Secondary | ICD-10-CM

## 2018-02-27 DIAGNOSIS — D649 Anemia, unspecified: Secondary | ICD-10-CM | POA: Diagnosis not present

## 2018-02-27 DIAGNOSIS — Z515 Encounter for palliative care: Secondary | ICD-10-CM | POA: Diagnosis not present

## 2018-02-27 DIAGNOSIS — Z87891 Personal history of nicotine dependence: Secondary | ICD-10-CM | POA: Diagnosis not present

## 2018-02-27 DIAGNOSIS — R0902 Hypoxemia: Secondary | ICD-10-CM | POA: Diagnosis not present

## 2018-02-27 DIAGNOSIS — Z8249 Family history of ischemic heart disease and other diseases of the circulatory system: Secondary | ICD-10-CM | POA: Diagnosis not present

## 2018-02-27 DIAGNOSIS — M5489 Other dorsalgia: Secondary | ICD-10-CM | POA: Diagnosis not present

## 2018-02-27 LAB — CBC WITH DIFFERENTIAL/PLATELET
Abs Immature Granulocytes: 0.4 10*3/uL — ABNORMAL HIGH (ref 0.00–0.07)
Basophils Absolute: 0 10*3/uL (ref 0.0–0.1)
Basophils Relative: 0 %
Eosinophils Absolute: 0.1 10*3/uL (ref 0.0–0.5)
Eosinophils Relative: 0 %
HCT: 35 % — ABNORMAL LOW (ref 39.0–52.0)
Hemoglobin: 11.3 g/dL — ABNORMAL LOW (ref 13.0–17.0)
Immature Granulocytes: 2 %
Lymphocytes Relative: 5 %
Lymphs Abs: 0.9 10*3/uL (ref 0.7–4.0)
MCH: 32 pg (ref 26.0–34.0)
MCHC: 32.3 g/dL (ref 30.0–36.0)
MCV: 99.2 fL (ref 80.0–100.0)
Monocytes Absolute: 0.7 10*3/uL (ref 0.1–1.0)
Monocytes Relative: 4 %
Neutro Abs: 15.2 10*3/uL — ABNORMAL HIGH (ref 1.7–7.7)
Neutrophils Relative %: 89 %
Platelets: 195 10*3/uL (ref 150–400)
RBC: 3.53 MIL/uL — AB (ref 4.22–5.81)
RDW: 16.5 % — ABNORMAL HIGH (ref 11.5–15.5)
WBC: 17.3 10*3/uL — ABNORMAL HIGH (ref 4.0–10.5)
nRBC: 0 % (ref 0.0–0.2)

## 2018-02-27 LAB — TYPE AND SCREEN
ABO/RH(D): O POS
Antibody Screen: NEGATIVE

## 2018-02-27 LAB — COMPREHENSIVE METABOLIC PANEL
ALT: 33 U/L (ref 0–44)
AST: 48 U/L — ABNORMAL HIGH (ref 15–41)
Albumin: 2.6 g/dL — ABNORMAL LOW (ref 3.5–5.0)
Alkaline Phosphatase: 80 U/L (ref 38–126)
Anion gap: 16 — ABNORMAL HIGH (ref 5–15)
BUN: 41 mg/dL — ABNORMAL HIGH (ref 8–23)
CO2: 25 mmol/L (ref 22–32)
Calcium: 8 mg/dL — ABNORMAL LOW (ref 8.9–10.3)
Chloride: 90 mmol/L — ABNORMAL LOW (ref 98–111)
Creatinine, Ser: 2.67 mg/dL — ABNORMAL HIGH (ref 0.61–1.24)
GFR calc Af Amer: 24 mL/min — ABNORMAL LOW (ref >60)
GFR calc non Af Amer: 21 mL/min — ABNORMAL LOW (ref >60)
Glucose, Bld: 142 mg/dL — ABNORMAL HIGH (ref 70–99)
Potassium: 3.2 mmol/L — ABNORMAL LOW (ref 3.5–5.1)
Sodium: 131 mmol/L — ABNORMAL LOW (ref 135–145)
Total Bilirubin: 1.5 mg/dL — ABNORMAL HIGH (ref 0.3–1.2)
Total Protein: 5.7 g/dL — ABNORMAL LOW (ref 6.5–8.1)

## 2018-02-27 LAB — URINALYSIS, ROUTINE W REFLEX MICROSCOPIC
BILIRUBIN URINE: NEGATIVE
Glucose, UA: NEGATIVE mg/dL
Ketones, ur: NEGATIVE mg/dL
NITRITE: NEGATIVE
Protein, ur: NEGATIVE mg/dL
Specific Gravity, Urine: 1.01 (ref 1.005–1.030)
pH: 5.5 (ref 5.0–8.0)

## 2018-02-27 LAB — URINALYSIS, MICROSCOPIC (REFLEX): RBC / HPF: NONE SEEN RBC/hpf (ref 0–5)

## 2018-02-27 LAB — I-STAT CG4 LACTIC ACID, ED: Lactic Acid, Venous: 1.68 mmol/L (ref 0.5–1.9)

## 2018-02-27 LAB — ABO/RH: ABO/RH(D): O POS

## 2018-02-27 MED ORDER — OXYCODONE HCL 5 MG PO TABS
5.0000 mg | ORAL_TABLET | Freq: Four times a day (QID) | ORAL | Status: DC
  Administered 2018-02-27 – 2018-02-28 (×5): 5 mg via ORAL
  Filled 2018-02-27 (×5): qty 1

## 2018-02-27 MED ORDER — PRAVASTATIN SODIUM 40 MG PO TABS
80.0000 mg | ORAL_TABLET | Freq: Every day | ORAL | Status: DC
  Administered 2018-02-27: 80 mg via ORAL
  Filled 2018-02-27: qty 2

## 2018-02-27 MED ORDER — ONDANSETRON 4 MG PO TBDP: 4.0000 mg | ORAL_TABLET | Freq: Four times a day (QID) | ORAL | Status: DC | PRN

## 2018-02-27 MED ORDER — PRAVASTATIN SODIUM 40 MG PO TABS
80.0000 mg | ORAL_TABLET | Freq: Every day | ORAL | Status: DC
  Filled 2018-02-27: qty 2

## 2018-02-27 MED ORDER — ONDANSETRON HCL 4 MG/2ML IJ SOLN
4.0000 mg | Freq: Once | INTRAMUSCULAR | Status: AC
  Administered 2018-02-27: 4 mg via INTRAVENOUS
  Filled 2018-02-27: qty 2

## 2018-02-27 MED ORDER — ADULT MULTIVITAMIN W/MINERALS CH
1.0000 | ORAL_TABLET | Freq: Every day | ORAL | Status: DC
  Administered 2018-02-27 – 2018-02-28 (×2): 1 via ORAL
  Filled 2018-02-27 (×2): qty 1

## 2018-02-27 MED ORDER — MORPHINE SULFATE (PF) 4 MG/ML IV SOLN
4.0000 mg | Freq: Once | INTRAVENOUS | Status: AC
  Administered 2018-02-27: 4 mg via INTRAVENOUS
  Filled 2018-02-27: qty 1

## 2018-02-27 MED ORDER — FUROSEMIDE 40 MG PO TABS
40.0000 mg | ORAL_TABLET | Freq: Every day | ORAL | Status: DC
  Administered 2018-02-27 – 2018-02-28 (×2): 40 mg via ORAL
  Filled 2018-02-27 (×2): qty 1

## 2018-02-27 MED ORDER — ONDANSETRON HCL 4 MG/2ML IJ SOLN: 4.0000 mg | Freq: Four times a day (QID) | INTRAMUSCULAR | Status: DC | PRN

## 2018-02-27 MED ORDER — SODIUM BICARBONATE 650 MG PO TABS
650.0000 mg | ORAL_TABLET | Freq: Two times a day (BID) | ORAL | Status: DC
  Administered 2018-02-27 – 2018-02-28 (×3): 650 mg via ORAL
  Filled 2018-02-27 (×3): qty 1

## 2018-02-27 MED ORDER — AMLODIPINE BESYLATE 5 MG PO TABS
5.0000 mg | ORAL_TABLET | Freq: Every day | ORAL | Status: DC
  Administered 2018-02-27 – 2018-02-28 (×2): 5 mg via ORAL
  Filled 2018-02-27 (×2): qty 1

## 2018-02-27 MED ORDER — ACETAMINOPHEN 650 MG RE SUPP: 650.0000 mg | Freq: Four times a day (QID) | RECTAL | Status: DC | PRN

## 2018-02-27 MED ORDER — MORPHINE SULFATE (PF) 2 MG/ML IV SOLN
1.0000 mg | INTRAVENOUS | Status: DC | PRN
  Administered 2018-02-27 (×2): 1 mg via INTRAVENOUS
  Filled 2018-02-27 (×2): qty 1

## 2018-02-27 MED ORDER — LOPERAMIDE HCL 2 MG PO CAPS
2.0000 mg | ORAL_CAPSULE | ORAL | Status: DC
  Administered 2018-02-27: 2 mg via ORAL
  Filled 2018-02-27: qty 1

## 2018-02-27 MED ORDER — HYDROCORTISONE 1 % EX CREA
1.0000 "application " | TOPICAL_CREAM | CUTANEOUS | Status: DC | PRN
  Filled 2018-02-27: qty 28

## 2018-02-27 MED ORDER — OXYCODONE HCL 5 MG PO TABS: 5.0000 mg | ORAL_TABLET | ORAL | Status: DC | PRN

## 2018-02-27 MED ORDER — SERTRALINE HCL 100 MG PO TABS
100.0000 mg | ORAL_TABLET | Freq: Every day | ORAL | Status: DC
  Administered 2018-02-27 – 2018-02-28 (×2): 100 mg via ORAL
  Filled 2018-02-27 (×2): qty 1

## 2018-02-27 MED ORDER — MORPHINE SULFATE (PF) 2 MG/ML IV SOLN
2.0000 mg | INTRAVENOUS | Status: DC | PRN
  Administered 2018-02-27 – 2018-02-28 (×3): 2 mg via INTRAVENOUS
  Filled 2018-02-27 (×3): qty 1

## 2018-02-27 MED ORDER — OXYCODONE HCL 5 MG PO TABS
5.0000 mg | ORAL_TABLET | ORAL | Status: DC | PRN
  Administered 2018-02-27: 5 mg via ORAL
  Filled 2018-02-27: qty 1

## 2018-02-27 MED ORDER — ACETAMINOPHEN 325 MG PO TABS: 650.0000 mg | ORAL_TABLET | Freq: Four times a day (QID) | ORAL | Status: DC | PRN

## 2018-02-27 NOTE — ED Notes (Signed)
Bed: HW86 Expected date:  Expected time:  Means of arrival:  Comments: 84 yo M/ Back pain

## 2018-02-27 NOTE — ED Notes (Signed)
ED TO INPATIENT HANDOFF REPORT  Name/Age/Gender [name] [name] [name] [name] 84 y.o. male  Code Status   Home/SNF/Other Home  Chief Complaint back pain  Level of Care/Admitting Diagnosis ED Disposition    ED Disposition Condition Comment   Transfer to Another Facility  The patient appears reasonably stabilized for transfer considering the current resources, flow, and capabilities available in the ED at this time, and I doubt any other [facility] requiring further screening and/or treatment in the ED prior to transfer is p resent.       Medical History Past Medical History:  Diagnosis Date  . AAA (abdominal aortic aneurysm) (HCC)   . Benign hypertension with CKD (chronic kidney disease) stage IV (HCC)   . Diastolic dysfunction   . HTN (hypertension)   . Hyperlipidemia   . Hyperparathyroidism (HCC)   . Thyroid disease     Allergies No Known Allergies  IV Location/Drains/Wounds Patient Lines/Drains/Airways Status   Active Line/Drains/Airways    Name:   Placement date:   Placement time:   Site:   Days:   Peripheral IV 02/27/18 Right Antecubital   02/27/18    0450    Antecubital   less than 1   External Urinary Catheter   02/27/18    0450    -   less than 1          Labs/Imaging Results for orders placed or performed during the hospital encounter of 02/27/18 (from the past 48 hour(s))  Comprehensive metabolic panel     Status: Abnormal   Collection Time: 02/27/18  4:25 AM  Result Value Ref Range   Sodium 131 (L) 135 - 145 mmol/L   Potassium 3.2 (L) 3.5 - 5.1 mmol/L   Chloride 90 (L) 98 - 111 mmol/L   CO2 25 22 - 32 mmol/L   Glucose, Bld 142 (H) 70 - 99 mg/dL   BUN 41 (H) 8 - 23 mg/dL   Creatinine, Ser 8.56 (H) 0.61 - 1.24 mg/dL   Calcium 8.0 (L) 8.9 - 10.3 mg/dL   Total Protein 5.7 (L) 6.5 - 8.1 g/dL   Albumin 2.6 (L) 3.5 - 5.0 g/dL   AST 48 (H) 15 - 41 U/L   ALT 33 0 - 44 U/L   Alkaline Phosphatase 80 38 - 126 U/L   Total Bilirubin 1.5 (H) 0.3 - 1.2 mg/dL   GFR calc non Af  Amer 21 (L) >60 mL/min   GFR calc Af Amer 24 (L) >60 mL/min   Anion gap 16 (H) 5 - 15    Comment: Performed at [facility], [address], [place], [place] [address]  CBC with Differential     Status: Abnormal   Collection Time: 02/27/18  4:25 AM  Result Value Ref Range   WBC 17.3 (H) 4.0 - 10.5 K/uL   RBC 3.53 (L) 4.22 - 5.81 MIL/uL   Hemoglobin 11.3 (L) 13.0 - 17.0 g/dL   HCT 02.6 (L) 37.8 - 58.8 %   MCV 99.2 80.0 - 100.0 fL   MCH 32.0 26.0 - 34.0 pg   MCHC 32.3 30.0 - 36.0 g/dL   RDW 50.2 (H) 77.4 - 12.8 %   Platelets 195 150 - 400 K/uL    Comment: REPEATED TO VERIFY   nRBC 0.0 0.0 - 0.2 %   Neutrophils Relative % 89 %   Neutro Abs 15.2 (H) 1.7 - 7.7 K/uL   Lymphocytes Relative 5 %   Lymphs Abs 0.9 0.7 - 4.0 K/uL  Monocytes Relative 4 %   Monocytes Absolute 0.7 0.1 - 1.0 K/uL   Eosinophils Relative 0 %   Eosinophils Absolute 0.1 0.0 - 0.5 K/uL   Basophils Relative 0 %   Basophils Absolute 0.0 0.0 - 0.1 K/uL   Immature Granulocytes 2 %   Abs Immature Granulocytes 0.40 (H) 0.00 - 0.07 K/uL    Comment: Performed at [facility], [address] [place], [place] [address]  I-Stat CG4 Lactic Acid, ED     Status: None   Collection Time: 02/27/18  4:59 AM  Result Value Ref Range   Lactic Acid, Venous 1.68 0.5 - 1.9 mmol/L   Ct Abdomen Pelvis Wo Contrast  Result Date: 02/27/2018 CLINICAL DATA:  Acute onset of pulsatile abdominal mass. Assess for abdominal aortic aneurysm. EXAM: CT ABDOMEN AND PELVIS WITHOUT CONTRAST TECHNIQUE: Multidetector CT imaging of the abdomen and pelvis was performed following the standard protocol without IV contrast. COMPARISON:  CT of the abdomen and pelvis performed 10/06/2010 FINDINGS: Lower chest: [name] small right pleural effusion is noted. Underlying bronchiectasis is seen, with scarring at the lung bases bilaterally. There is aneurysmal dilatation of the descending thoracic aorta to 4.4 cm in AP dimension, and to  4.7 cm in transverse dimension more distally. Hepatobiliary: The liver is unremarkable in appearance. The patient is status post cholecystectomy, with clips noted at the gallbladder fossa. The common bile duct remains normal in caliber. Pancreas: There is diffuse atrophy of much of the pancreas. Spleen: The spleen is unremarkable in appearance. Adrenals/Urinary Tract: The adrenal glands are unremarkable in appearance. [name] large left renal cyst is noted. Relatively severe bilateral renal atrophy is noted, more prominent on the right. There is no evidence of hydronephrosis. No renal or ureteral stones are identified. Nonspecific perinephric stranding is noted bilaterally. Stomach/Bowel: The stomach is unremarkable in appearance. The small bowel is within normal limits. The appendix is not visualized; there is no evidence for appendicitis. Mild diverticulosis is noted along the sigmoid colon, without evidence of diverticulitis. Vascular/Lymphatic: There is marked worsening of the patient's abdominal aortic aneurysm, now measuring approximately 9.2 cm in AP dimension and 10.2 cm in transverse dimension. Vague surrounding soft tissue inflammation is noted, with focal outpouching at the anterior left wall, and additional soft tissue inflammation tracking inferiorly into the pelvis. This is concerning for leakage from the patient's aortic aneurysm. There is compression of the IVC by the enlarged aneurysm. Prominence of the left common iliac vein is of uncertain significance. Scattered calcification is seen along the abdominal aorta and its branches. No retroperitoneal or pelvic sidewall lymphadenopathy is seen. Reproductive: The bladder is moderately distended and grossly unremarkable. The prostate is normal in size, with scattered calcification. Other: No additional soft tissue abnormalities are seen. Musculoskeletal: No acute osseous abnormalities are identified. The visualized musculature is unremarkable in appearance.  IMPRESSION: 1. Marked worsening abdominal aortic aneurysm, now measuring 9.2 cm in AP dimension at 10.2 cm in transverse dimension. Surrounding soft tissue inflammation noted extending inferiorly into the pelvis, concerning for gradual hemorrhage from the aortic aneurysm. Focal outpouching of the anterior left aneurysm wall, of uncertain significance. 2. Small right pleural effusion. Scarring at the lung bases, with underlying bronchiectasis. 3. Relatively severe bilateral renal atrophy. Large left renal cyst. 4. Mild diverticulosis along the sigmoid colon, without evidence of diverticulitis. Critical Value/emergent results were called by telephone at the time of interpretation on 02/27/2018 at 5:18 am to [facility], who verbally acknowledged these results. Electronically Signed  By: Roanna Raider M.D.   On: 02/27/2018 05:25   None  Pending Labs Unresulted Labs (From admission, onward)    Start     Ordered   02/27/18 0429  Type and screen [facility]  Once,   STAT    Comments:  [facility]    02/27/18 0428   02/27/18 0425  Urinalysis, Routine w reflex microscopic  ONCE - STAT,   STAT     02/27/18 0426          Vitals/Pain Today's Vitals   02/27/18 0445 02/27/18 0515 02/27/18 0530 02/27/18 0545  BP:   138/78   Pulse: 67 72 74 67  Resp:      Temp:      TempSrc:      SpO2: 94% 94% 98% 93%  Weight:      Height:      PainSc:        Isolation Precautions No active isolations  Medications Medications  morphine 4 MG/ML injection 4 mg (has no administration in time range)  morphine 4 MG/ML injection 4 mg (4 mg Intravenous Given 02/27/18 0507)  ondansetron (ZOFRAN) injection 4 mg (4 mg Intravenous Given 02/27/18 0505)    Mobility walks

## 2018-02-27 NOTE — ED Notes (Signed)
Attempted report 

## 2018-02-27 NOTE — ED Notes (Signed)
Patient given a pilliow

## 2018-02-27 NOTE — ED Notes (Signed)
Report given to 6N RN. 

## 2018-02-27 NOTE — Consult Note (Addendum)
Hospital Consult    Reason for Consult:  10 cm AAA Referring Physician:  ED MRN #:  [id number]  History of Present Illness: This is a 84 y.o. male with history of hypertension, hyperlipidemia, stage IV chronic kidney disease, diastolic dysfunction that presents as a transfer from [facility] for evaluation of his abdominal aortic aneurysm.  Patient reports he started having abdominal pain several weeks ago that prompted presentation to [facility] ED last night.  A noncontrast CT was obtained that showed a 10 cm abdominal aortic aneurysm and surrounding soft tissue inflammation concerning for gradual hemorrhage and impending rupture.  Vascular surgery was subsequently consulted from [facility] and recommended transfer to [facility] for further evaluation. In discussing with the patient he was last seen by Dr. Myra Gianotti here in 2012 and ultimately ended up being followed by a vascular surgeon at [facility].  Patient states he declined repair 5 years ago when there was risk he could end up on dialysis (per patients report).  After deciding on not having his aneurysm repair he stopped following up and has not been seen by a vascular surgeon in over 5 years. He now lives at home with his wife and is fairly frail.  States he can only walk about 25 feet before he has to stop due to shortness of breath.  Past Medical History:  Diagnosis Date  . AAA (abdominal aortic aneurysm) (HCC)   . Benign hypertension with CKD (chronic kidney disease) stage IV (HCC)   . Diastolic dysfunction   . HTN (hypertension)   . Hyperlipidemia   . Hyperparathyroidism (HCC)   . Thyroid disease     Past Surgical History:  Procedure Laterality Date  . CYSTECTOMY  2003   removed from pancrease, per patient at [facility] NP appointment     No Known Allergies  Prior to Admission medications   Medication Sig Start Date End Date Taking? Authorizing Provider  acetaminophen (TYLENOL) 500 MG tablet Take 500 mg by mouth 2 (two)  times daily.    [provider]  amLODipine (NORVASC) 10 MG tablet TAKE 1 TABLET(10 MG) BY MOUTH DAILY 12/14/17   Donita Brooks, MD  furosemide (LASIX) 40 MG tablet Take 1 tablet (40 mg total) by mouth daily. 02/22/18   Sharon Seller, NP  loperamide (IMODIUM) 2 MG capsule Take 2 mg by mouth every other day.    [provider]  moxifloxacin (AVELOX) 400 MG tablet Take 1 tablet (400 mg total) by mouth daily. 02/23/18   Sharon Seller, NP  Phenyleph-CPM-DM-Aspirin (ALKA-SELTZER PLUS COLD & COUGH PO) Take by mouth every 4 (four) hours.    [provider]  potassium chloride SA (K-DUR,KLOR-CON) 20 MEQ tablet Take 1 tablet (20 mEq total) by mouth daily. 02/23/18   Sharon Seller, NP  pravastatin (PRAVACHOL) 80 MG tablet TAKE 1 TABLET BY MOUTH DAILY AT 6 PM 12/14/17   Donita Brooks, MD  sertraline (ZOLOFT) 50 MG tablet TAKE 2 TABLETS(100 MG) BY MOUTH DAILY 06/14/17   Donita Brooks, MD  sodium bicarbonate 650 MG tablet TAKE 1 TABLET(650 MG) BY MOUTH TWICE DAILY 06/14/17   Donita Brooks, MD    Social History   Socioeconomic History  . Marital status: Married    Spouse name: Not on file  . Number of children: Not on file  . Years of education: Not on file  . Highest education level: Not on file  Occupational History  . Not on file  Social Needs  . Financial resource strain: Not on file  . Food insecurity:    Worry: Not on file    Inability: Not on file  . Transportation needs:    Medical: Not on file    Non-medical: Not on file  Tobacco Use  . Smoking status: Former Smoker    Packs/day: 1.00    Years: 55.00    Pack years: 55.00    Types: Cigarettes    Last attempt to quit: 1990    Years since quitting: 29.9  . Smokeless tobacco: Former Neurosurgeon    Quit date: 03/09/1988  Substance and Sexual Activity  . Alcohol use: No    Comment: stopped drinking in 1983  . Drug use: No  . Sexual activity: Not Currently  Lifestyle  . Physical activity:      Days per week: Not on file    Minutes per session: Not on file  . Stress: Not on file  Relationships  . Social connections:    Talks on phone: Not on file    Gets together: Not on file    Attends religious service: Not on file    Active member of club or organization: Not on file    Attends meetings of clubs or organizations: Not on file    Relationship status: Not on file  . Intimate partner violence:    Fear of current or ex partner: Not on file    Emotionally abused: Not on file    Physically abused: Not on file    Forced sexual activity: Not on file  Other Topics Concern  . Not on file  Social History Narrative   Social History      Diet? anything      Do you drink/eat things with caffeine? yes      Marital status?           married                         What year were you married? 1954      Do you live in a house, apartment, assisted living, condo, trailer, etc.? house      Is it one or more stories? one      How many persons live in your home? 2      Do you have any pets in your home? (please list) no      Highest level of education completed? 8th grade      Current or past profession: Scientist, product/process development      Do you exercise?              no                        Type & how often?      Advanced Directives      Do you have a living will? no      Do you have a DNR form?                                  If not, do you want to discuss one? no      Do you have signed POA/HPOA for forms?  no      Functional Status      Do you have difficulty bathing or dressing yourself? no      Do you have  difficulty preparing food or eating? no      Do you have difficulty managing your medications? no      Do you have difficulty managing your finances? no      Do you have difficulty affording your medications? no     Family History  Problem Relation Age of Onset  . Heart failure Mother 6  . Other Father 102       fall  . Heart attack Brother 58  . Other  Brother        Lung problems    ROS: [x]  Positive   [ ]  Negative   [ ]  All sytems reviewed and are negative  Cardiovascular: []  chest pain/pressure []  palpitations []  SOB lying flat [x]  DOE []  pain in legs while walking []  pain in legs at rest []  pain in legs at night []  non-healing ulcers []  hx of DVT []  swelling in legs  Pulmonary: []  productive cough []  asthma/wheezing []  home O2  Neurologic: []  weakness in []  arms []  legs []  numbness in []  arms []  legs []  hx of CVA []  mini stroke [] difficulty speaking or slurred speech []  temporary loss of vision in one eye []  dizziness  Hematologic: []  hx of cancer []  bleeding problems []  problems with blood clotting easily  Endocrine:   []  diabetes []  thyroid disease  GI []  vomiting blood []  blood in stool [X] Abdominal Pain  GU: []  CKD/renal failure []  HD--[]  M/W/F or []  T/T/S []  burning with urination []  blood in urine  Psychiatric: []  anxiety []  depression  Musculoskeletal: []  arthritis []  joint pain  Integumentary: []  rashes []  ulcers  Constitutional: []  fever []  chills   Physical Examination  Vitals:   02/27/18 0600 02/27/18 0658  BP: (!) 139/96 139/85  Pulse: 75 73  Resp:  (!) 21  Temp:    SpO2: 94% 92%   Body mass index is 19.85 kg/m.  General:  Resting looks frail Gait: Not observed HENT: WNL, normocephalic Pulmonary: Room air, no distress. Cardiac: regular, without  Murmurs, rubs or gallops; without carotid bruits Abdomen: large pulsatile mass, tender on exam when palpated Vascular Exam/Pulses:  Right Left  Radial 2+ (normal) 2+ (normal)  Ulnar    Femoral 2+ (normal) 2+ (normal)  Popliteal    DP    PT 1+ (weak) 1+ (weak)   Extremities: without ischemic changes, without Gangrene , without cellulitis; without open wounds;  Musculoskeletal: no muscle wasting or atrophy  Neurologic: A&O X 3; Appropriate Affect ; SENSATION: normal; MOTOR FUNCTION:  moving all extremities equally.  Speech is fluent/normal   CBC    Component Value Date/Time   WBC 17.3 (H) 02/27/2018 0425   RBC 3.53 (L) 02/27/2018 0425   HGB 11.3 (L) 02/27/2018 0425   HCT 35.0 (L) 02/27/2018 0425   PLT 195 02/27/2018 0425   MCV 99.2 02/27/2018 0425   MCH 32.0 02/27/2018 0425   MCHC 32.3 02/27/2018 0425   RDW 16.5 (H) 02/27/2018 0425   LYMPHSABS 0.9 02/27/2018 0425   MONOABS 0.7 02/27/2018 0425   EOSABS 0.1 02/27/2018 0425   BASOSABS 0.0 02/27/2018 0425    BMET    Component Value Date/Time   NA 131 (L) 02/27/2018 0425   NA 140 02/22/2018   K 3.2 (L) 02/27/2018 0425   CL 90 (L) 02/27/2018 0425   CO2 25 02/27/2018 0425   GLUCOSE 142 (H) 02/27/2018 0425   BUN 41 (H) 02/27/2018 0425   BUN 26 (A) 02/22/2018   CREATININE 2.67 (H)  02/27/2018 0425   CREATININE 2.17 (H) 02/21/2018 0958   CALCIUM 8.0 (L) 02/27/2018 0425   CALCIUM 8.5 10/19/2008 0550   GFRNONAA 21 (L) 02/27/2018 0425   GFRNONAA 27 (L) 02/21/2018 0958   GFRAA 24 (L) 02/27/2018 0425   GFRAA 31 (L) 02/21/2018 0958    COAGS: Lab Results  Component Value Date   INR 1.16 12/11/2008   INR 1.3 10/28/2008   INR 1.2 10/19/2008     Non-Invasive Vascular Imaging:    CT abdomen pelvis without contrast: I have independently reviewed his CT scan which does show an approximate 10 cm abdominal aortic aneurysm and the aneurysm is degenerated all the way to the renal arteries.  There is essentially no neck for endograft seal.  Both of his kidneys look atretic.  He has hazy stranding circumferentially around the aneurysm.  There is a focal outpouching on the left anterior wall concerning for impending rupture.  He also has degeneration of his thoracic aorta that measures 4.4 cm.   ASSESSMENT/PLAN: This is a 84 y.o. male with history of hypertension, hyperlipidemia, stage IV chronic kidney disease, diastolic dysfunction, and frailty who presents with a symptomatic 10 cm abdominal aortic aneurysm with stranding around the aneurysm and focal  outpouching that is concerning it is leaking at this time (non-contrast CT).  I had a long discussion with the family at bedside regarding options moving forward.  Ultimately the patient made the decision 5 years ago not to have the [place] [facility] due to risk of needing dialysis.  He is adamant today that he does not want any intervention at this time and would like to go home with his family with palliative and hospice services.  He does not want any intervention with the chance of needing dialysis and GFR 21 today.   I do support this decision given that he is now 84 years old and looks very frail and states he can only walk about 25 feet until he has to stop due to SOB.  I do not see a straightforward endovascular option after reviewing CT scan given that his aneurysm is degenerated all the way to the renal arteries and there is no neck for seal.  I would envision this would likely require a open repair with suprarenal clamp.  Other option wound be to cover renals or snorkel but high risk for dialysis post-op.  Nevertheless patient is comfortable with his decision 5 years ago not to have his aneurysm repaired and wants to go home.  Will engage hospice/ palliative care services.  Cephus Shellinghristopher J. Almetta Liddicoat, MD [facility] Office: [phone] Pager: [phone]  Cephus Shellinghristopher J Aailyah Dunbar

## 2018-02-27 NOTE — Consult Note (Signed)
Consultation Note Date: 02/27/2018   Patient Name: [name] [name]  DOB: February 02, 1930  MRN: [id number]  Age / Sex: 84 y.o., male  PCP: [name] Chandler, NP Referring Physician: Antonieta Pert, MD  Reason for Consultation: Establishing goals of care, Hospice Evaluation, Pain control and Psychosocial/spiritual support  HPI/Patient Profile: 84 y.o. male  with past medical history of AAA, hypertension, chronic kidney disease stage IV, diastolic heart failure, hyperlipidemia admitted on 02/27/2018 with acute back pain x2 weeks.  Patient presented to the emergency room on 02/27/2018 complaining of back pain that he rated as a 10 of 10.  Patient has a longstanding history of AAA.  He refused repair 5 years ago.  He is seen every 6 months by vascular services I believe in [place].  Imaging taking on the day of admission shows a market increase in his aneurysm now to 10 cm.  Vascular services were consulted and patient declines further interventions.  He seems to have a good grasp of his underlying condition particularly the size of this aneurysm, in the setting of chronic kidney disease stage IV.  His goal is to return home with hospice.  Consult ordered for hospice evaluation, referral as well as pain management.   Clinical Assessment and Goals of Care: Met with patient, chart reviewed.  Patient's son, [name] [name], is at the bedside.  Patient had just received approximately 2 mg of morphine, oxycodone 5 mg and is verbalizing at this point he finally feels "comfortable".  He describes the pain as continuous and as noted has rated it as a 10 of 10 over the past 24 or more hours.  [place] and worked at [employer].  He is married and his wife has cancer.  He has been her caregiver.  They have 4 sons who live locally and are available to help their parents.  Patient is requesting comfort care/ symptom based approach  and home with hospice.  Discussed medications available to help with medication management, risks and benefits of opioids, as well as hospice services.  Patient is able to make his own decisions.  His healthcare proxy would be his wife [name] [name] if he were unable to speak for himself.  My sense is that they make decisions as a family and their children would be involved in any other further decision making along with patient    SUMMARY OF RECOMMENDATIONS   Confirmed DNR Home with hospice Consult placed to care management to initiate hospice referral.  Offered choice per Medicare guidelines for hospice and they are electing hospice and [facility] of [facility] services.  I have notified hospice and palliative care of [place] directly of [name] is desire to have their services Will start scheduled medications in order to better manage his pain Palliative medicine to follow for pain management overnight as well as disposition Patient is hopeful to be discharged tomorrow home with the support of hospice.  Would recommend that hospice leave an emergency medication kit in the home as patient  is at high risk for an acute event such as exsanguination Code Status/Advance Care Planning:  DNR    Symptom Management:   Pain: It appears that 2 mg of morphine and 5 mg of oxycodone have achieved pain relief to where patient is not unduly sedated and feels comfortable.  We will start scheduled oxycodone to pevent him from getting into a pain crisis at 5 mg every 6 hours around-the-clock; will expand range of PRN oxycodone to 5 to 10 mg every 3 hours as needed as well as continuing morphine IV,increase to 2 to 4 mg every 1 hour as needed  Nausea: No reports of nausea presently; continue with Zofran as needed  Palliative Prophylaxis:   Aspiration, Bowel Regimen, Delirium Protocol, Eye Care, Frequent Pain Assessment and Turn Reposition  Additional Recommendations (Limitations, Scope,  Preferences):  Full Comfort Care  Psycho-social/Spiritual:   Desire for further Chaplaincy support:no  Additional Recommendations: Referral to Community Resources   Prognosis:   < 4 weeks in the setting of 10 cm AAA with associated back pain, compression of the IVC  Discharge Planning: Home with Home Health      Primary Diagnoses: Present on Admission: . Abdominal aortic aneurysm, not a candidate for repair [facility])   I have reviewed the medical record, interviewed the patient and family, and examined the patient. The following aspects are pertinent.  Past Medical History:  Diagnosis Date  . AAA (abdominal aortic aneurysm) ([place])   . Benign hypertension with CKD (chronic kidney disease) stage IV ([place])   . Diastolic dysfunction   . HTN (hypertension)   . Hyperlipidemia   . Hyperparathyroidism ([place])   . Thyroid disease    Social History   Socioeconomic History  . Marital status: Married    Spouse name: Not on file  . Number of children: Not on file  . Years of education: Not on file  . Highest education level: Not on file  Occupational History  . Not on file  Social Needs  . Financial resource strain: Not on file  . Food insecurity:    Worry: Not on file    Inability: Not on file  . Transportation needs:    Medical: Not on file    Non-medical: Not on file  Tobacco Use  . Smoking status: Former Smoker    Packs/day: 1.00    Years: 55.00    Pack years: 55.00    Types: Cigarettes    Last attempt to quit: 1990    Years since quitting: 29.9  . Smokeless tobacco: Former Systems developer    Quit date: 03/09/1988  Substance and Sexual Activity  . Alcohol use: No    Comment: stopped drinking in 1983  . Drug use: No  . Sexual activity: Not Currently  Lifestyle  . Physical activity:    Days per week: Not on file    Minutes per session: Not on file  . Stress: Not on file  Relationships  . Social connections:    Talks on phone: Not on file    Gets together: Not on file     Attends religious service: Not on file    Active member of club or organization: Not on file    Attends meetings of clubs or organizations: Not on file    Relationship status: Not on file  Other Topics Concern  . Not on file  Social History Narrative   Social History      Diet? anything      Do you drink/eat things  with caffeine? yes      Marital status?           married                         What year were you married? 1954      Do you live in a house, apartment, assisted living, condo, trailer, etc.? house      Is it one or more stories? one      How many persons live in your home? 2      Do you have any pets in your home? (please list) no      Highest level of education completed? 8th grade      Current or past profession: Engineer, manufacturing systems      Do you exercise?              no                        Type & how often?      Advanced Directives      Do you have a living will? no      Do you have a DNR form?                                  If not, do you want to discuss one? no      Do you have signed POA/HPOA for forms?  no      Functional Status      Do you have difficulty bathing or dressing yourself? no      Do you have difficulty preparing food or eating? no      Do you have difficulty managing your medications? no      Do you have difficulty managing your finances? no      Do you have difficulty affording your medications? no   Family History  Problem Relation Age of Onset  . Heart failure Mother 103  . Other Father 102       fall  . Heart attack Brother 34  . Other Brother        Lung problems   Scheduled Meds: . amLODipine  5 mg Oral Daily  . furosemide  40 mg Oral Daily  . loperamide  2 mg Oral QODAY  . multivitamin with minerals  1 tablet Oral Daily  . oxyCODONE  5 mg Oral Q6H  . pravastatin  80 mg Oral QHS  . sertraline  100 mg Oral Daily  . sodium bicarbonate  650 mg Oral BID   Continuous Infusions: PRN Meds:.acetaminophen **OR**  acetaminophen, hydrocortisone cream, morphine injection, ondansetron **OR** ondansetron (ZOFRAN) IV, oxyCODONE Medications Prior to Admission:  Prior to Admission medications   Medication Sig Start Date End Date Taking? Authorizing Provider  acetaminophen (TYLENOL) 500 MG tablet Take 500 mg by mouth 2 (two) times daily.   Yes [provider]  amLODipine (NORVASC) 10 MG tablet TAKE 1 TABLET(10 MG) BY MOUTH DAILY Patient taking differently: Take 10 mg by mouth daily.  12/14/17  Yes Susy Frizzle, MD  furosemide (LASIX) 40 MG tablet Take 1 tablet (40 mg total) by mouth daily. 02/22/18  Yes [name] Chandler, NP  hydrocortisone cream 1 % Apply 1 application topically as needed for itching.   Yes [provider]  loperamide (IMODIUM) 2 MG capsule Take 2 mg by  mouth every other day.   Yes [provider]  moxifloxacin (AVELOX) 400 MG tablet Take 1 tablet (400 mg total) by mouth daily. 02/23/18  Yes [name] Chandler, NP  Multiple Vitamin (MULTIVITAMIN WITH MINERALS) TABS tablet Take 1 tablet by mouth daily.   Yes [provider]  potassium chloride SA (K-DUR,KLOR-CON) 20 MEQ tablet Take 1 tablet (20 mEq total) by mouth daily. 02/23/18  Yes [name] Chandler, NP  pravastatin (PRAVACHOL) 80 MG tablet TAKE 1 TABLET BY MOUTH DAILY AT 6 PM Patient taking differently: Take 80 mg by mouth daily.  12/14/17  Yes Susy Frizzle, MD  sertraline (ZOLOFT) 50 MG tablet TAKE 2 TABLETS(100 MG) BY MOUTH DAILY Patient taking differently: Take 100 mg by mouth daily.  06/14/17  Yes Susy Frizzle, MD  sodium bicarbonate 650 MG tablet TAKE 1 TABLET(650 MG) BY MOUTH TWICE DAILY Patient taking differently: Take 650 mg by mouth 2 (two) times daily.  06/14/17  Yes Susy Frizzle, MD   No Known Allergies Review of Systems  Unable to perform ROS: Other    Physical Exam Vitals signs and nursing note reviewed.  HENT:     Head: Normocephalic and atraumatic.  Neck:      Musculoskeletal: Normal range of motion.  Cardiovascular:     Rate and Rhythm: Normal rate.  Pulmonary:     Effort: Pulmonary effort is normal.  Abdominal:     Comments: pulsatile mass  Skin:    General: Skin is warm and dry.  Neurological:     General: No focal deficit present.     Mental Status: He is alert and oriented to person, place, and time.  Psychiatric:        Mood and Affect: Mood normal.     Vital Signs: BP 107/86 (BP Location: Right Arm)   Pulse 75   Temp (!) 97.5 F (36.4 C) (Oral)   Resp 20   Ht _0  (1.676 m)   Wt 55.8 kg   SpO2 (!) 89%   BMI 19.85 kg/m  Pain Scale: 0-10 POSS *See Group Information*: 1-Acceptable,Awake and alert Pain Score: 1    SpO2: SpO2: (!) 89 % O2 Device:SpO2: (!) 89 % O2 Flow Rate: .   IO: Intake/output summary:   Intake/Output Summary (Last 24 hours) at 02/27/2018 1551 Last data filed at 02/27/2018 0451 Gross per 24 hour  Intake -  Output 0 ml  Net 0 ml    LBM:   Baseline Weight: Weight: 55.8 kg Most recent weight: Weight: 55.8 kg     Palliative Assessment/Data:   Flowsheet Rows     Most Recent Value  Intake Tab  Referral Department  Hospitalist  Unit at Time of Referral  Med/Surg Unit  Palliative Care Primary Diagnosis  Cardiac  Date Notified  02/27/18  Palliative Care Type  New Palliative care  Reason for referral  Pain, Counsel Regarding Hospice, Clarify Goals of Care  Date of Admission  02/27/18  Date first seen by Palliative Care  02/27/18  # of days Palliative referral response time  0 Day(s)  # of days IP prior to Palliative referral  0  Clinical Assessment  Palliative Performance Scale Score  40%  Pain Max last 24 hours  10  Pain Min Last 24 hours  4  Dyspnea Max Last 24 Hours  5  Dyspnea Min Last 24 hours  5  Nausea Max Last 24 Hours  0  Nausea Min Last 24 Hours  0  Anxiety Max Last 24 Hours  0  Anxiety Min Last 24 Hours  0  Psychosocial & Spiritual Assessment  Palliative Care Outcomes    Patient/Family meeting held?  Yes  Who was at the meeting?  pt and son [name] [name]  Palliative Care Outcomes  Improved pain interventions, Clarified goals of care, Provided psychosocial or spiritual support, Transitioned to hospice, Counseled regarding hospice  Palliative Care follow-up planned  Yes, Facility      Time In: 1330 Time Out: 2426 Time Total: 75 min Greater than 50%  of this time was spent counseling and coordinating care related to the above assessment and plan.  Signed by: Dory Horn, NP   Please contact Palliative Medicine Team phone at [phone] for questions and concerns.  For individual provider: See Shea Evans

## 2018-02-27 NOTE — ED Triage Notes (Addendum)
Per EMS, pt. From home with complaint of lower back pain x 2 weeks but this morning pain was unbearable. Pt. stated he had hx  of AAA  and just concerned for another one. Denied fall.  A/O x 4. Pain at 8/10. Denied fever. No SOB.

## 2018-02-27 NOTE — ED Notes (Signed)
Admitting at bedside 

## 2018-02-27 NOTE — ED Provider Notes (Signed)
Patient seen after arrival at [facility] ED for vascular evaluation.  Vascular Service has consulted on the patient.  Patient has declined operative intervention.  Patient is very straightforward in his request for comfort care measures.  I think it would be appropriate to admit this patient to medical bed with a pending palliative care consult.  Patient understands and is in agreement with plan for admission for comfort care measures.  Hospitalist service is aware of [place] and will evaluate for admission.   Wynetta Fines, MD 02/27/18 [phone]

## 2018-02-27 NOTE — ED Notes (Signed)
Patient is from [facility] ED here for consult with Vascular Surgery.  NAD, having "some pain" in his abdomen.  He is unable to give a number.  He states that it hurts a lot.  He has had this pain for two weeks.

## 2018-02-27 NOTE — ED Provider Notes (Signed)
[facility]-EMERGENCY DEPT Provider Note   CSN: [id number] Arrival date & time: 02/27/18  0411     History   Chief Complaint Chief Complaint  Patient presents with  . Back Pain    HPI [name] [name] is a 84 y.o. male.  The history is provided by the patient.  Back Pain    He has history of hypertension, hyperlipidemia, diastolic dysfunction, chronic kidney disease, abdominal aortic aneurysm and comes in complaining of low back pain since December 16.  He did see his primary care provider on 12/16.  He states he has been taking over-the-counter medication for it without relief.  He rates pain a 10/10.  He says it has been several years since the last time aneurysm was measured and he had been offered the option to have it operated on but declined because of the likelihood of going on dialysis.  Past Medical History:  Diagnosis Date  . AAA (abdominal aortic aneurysm) (HCC)   . Benign hypertension with CKD (chronic kidney disease) stage IV (HCC)   . Diastolic dysfunction   . HTN (hypertension)   . Hyperlipidemia   . Hyperparathyroidism (HCC)   . Thyroid disease     Patient Active Problem List   Diagnosis Date Noted  . Diastolic dysfunction   . Subclinical hypothyroidism 11/14/2014  . AAA (abdominal aortic aneurysm) (HCC)   . Benign hypertension with CKD (chronic kidney disease) stage IV (HCC)   . HTN (hypertension)   . Hyperlipidemia     Past Surgical History:  Procedure Laterality Date  . CYSTECTOMY  2003   removed from pancrease, per patient at [facility] NP appointment         Home Medications    Prior to Admission medications   Medication Sig Start Date End Date Taking? Authorizing Provider  acetaminophen (TYLENOL) 500 MG tablet Take 500 mg by mouth 2 (two) times daily.    [provider]  amLODipine (NORVASC) 10 MG tablet TAKE 1 TABLET(10 MG) BY MOUTH DAILY 12/14/17   Donita Brooks, MD  furosemide (LASIX) 40 MG tablet Take 1  tablet (40 mg total) by mouth daily. 02/22/18   Sharon Seller, NP  loperamide (IMODIUM) 2 MG capsule Take 2 mg by mouth every other day.    [provider]  moxifloxacin (AVELOX) 400 MG tablet Take 1 tablet (400 mg total) by mouth daily. 02/23/18   Sharon Seller, NP  Phenyleph-CPM-DM-Aspirin (ALKA-SELTZER PLUS COLD & COUGH PO) Take by mouth every 4 (four) hours.    [provider]  potassium chloride SA (K-DUR,KLOR-CON) 20 MEQ tablet Take 1 tablet (20 mEq total) by mouth daily. 02/23/18   Sharon Seller, NP  pravastatin (PRAVACHOL) 80 MG tablet TAKE 1 TABLET BY MOUTH DAILY AT 6 PM 12/14/17   Donita Brooks, MD  sertraline (ZOLOFT) 50 MG tablet TAKE 2 TABLETS(100 MG) BY MOUTH DAILY 06/14/17   Donita Brooks, MD  sodium bicarbonate 650 MG tablet TAKE 1 TABLET(650 MG) BY MOUTH TWICE DAILY 06/14/17   Donita Brooks, MD    Family History Family History  Problem Relation Age of Onset  . Heart failure Mother 69  . Other Father 102       fall  . Heart attack Brother 41  . Other Brother        Lung problems    Social History Social History   Tobacco Use  . Smoking status: Former Smoker    Packs/day: 1.00  Years: 55.00    Pack years: 55.00    Types: Cigarettes    Last attempt to quit: 1990    Years since quitting: 29.9  . Smokeless tobacco: Former NeurosurgeonUser    Quit date: 03/09/1988  Substance Use Topics  . Alcohol use: No    Comment: stopped drinking in 1983  . Drug use: No     Allergies   Patient has no known allergies.   Review of Systems Review of Systems  Musculoskeletal: Positive for back pain.  All other systems reviewed and are negative.    Physical Exam Updated Vital Signs BP (!) 141/93 (BP Location: Right Arm)   Pulse 76   Temp (!) 97.5 F (36.4 C) (Oral)   Resp 16   Ht 5\' 6"  (1.676 m)   Wt 55.8 kg   SpO2 94% Comment: Simultaneous filing. User may not have seen previous data.  BMI 19.85 kg/m   Physical Exam Vitals signs  and nursing note reviewed.    84 year old male, resting comfortably and in no acute distress. Vital signs are significant for mildly elevated blood pressure. Oxygen saturation is 94%, which is normal. Head is normocephalic and atraumatic. PERRLA, EOMI. Oropharynx is clear. Neck is nontender and supple without adenopathy or JVD. Back is nontender and there is no CVA tenderness. Lungs are clear without rales, wheezes, or rhonchi. Chest is nontender. Heart has regular rate and rhythm without murmur. Abdomen is soft, flat, nontender pulsatile mass noted in the upper abdomen.  There is no bruit.  There is no hepatosplenomegaly and peristalsis is hypoactive. Extremities have no cyanosis or edema, full range of motion is present. Skin is warm and dry without rash. Neurologic: Mental status is normal, cranial nerves are intact, there are no motor or sensory deficits.  ED Treatments / Results  Labs (all labs ordered are listed, but only abnormal results are displayed) Labs Reviewed  COMPREHENSIVE METABOLIC PANEL - Abnormal; Notable for the following components:      Result Value   Sodium 131 (*)    Potassium 3.2 (*)    Chloride 90 (*)    Glucose, Bld 142 (*)    BUN 41 (*)    Creatinine, Ser 2.67 (*)    Calcium 8.0 (*)    Total Protein 5.7 (*)    Albumin 2.6 (*)    AST 48 (*)    Total Bilirubin 1.5 (*)    GFR calc non Af Amer 21 (*)    GFR calc Af Amer 24 (*)    Anion gap 16 (*)    All other components within normal limits  CBC WITH DIFFERENTIAL/PLATELET - Abnormal; Notable for the following components:   WBC 17.3 (*)    RBC 3.53 (*)    Hemoglobin 11.3 (*)    HCT 35.0 (*)    RDW 16.5 (*)    Neutro Abs 15.2 (*)    Abs Immature Granulocytes 0.40 (*)    All other components within normal limits  URINALYSIS, ROUTINE W REFLEX MICROSCOPIC  I-STAT CG4 LACTIC ACID, ED  I-STAT CG4 LACTIC ACID, ED  TYPE AND SCREEN   Radiology Ct Abdomen Pelvis Wo Contrast  Result Date:  02/27/2018 CLINICAL DATA:  Acute onset of pulsatile abdominal mass. Assess for abdominal aortic aneurysm. EXAM: CT ABDOMEN AND PELVIS WITHOUT CONTRAST TECHNIQUE: Multidetector CT imaging of the abdomen and pelvis was performed following the standard protocol without IV contrast. COMPARISON:  CT of the abdomen and pelvis performed 10/06/2010 FINDINGS: Lower chest:  A small right pleural effusion is noted. Underlying bronchiectasis is seen, with scarring at the lung bases bilaterally. There is aneurysmal dilatation of the descending thoracic aorta to 4.4 cm in AP dimension, and to 4.7 cm in transverse dimension more distally. Hepatobiliary: The liver is unremarkable in appearance. The patient is status post cholecystectomy, with clips noted at the gallbladder fossa. The common bile duct remains normal in caliber. Pancreas: There is diffuse atrophy of much of the pancreas. Spleen: The spleen is unremarkable in appearance. Adrenals/Urinary Tract: The adrenal glands are unremarkable in appearance. A large left renal cyst is noted. Relatively severe bilateral renal atrophy is noted, more prominent on the right. There is no evidence of hydronephrosis. No renal or ureteral stones are identified. Nonspecific perinephric stranding is noted bilaterally. Stomach/Bowel: The stomach is unremarkable in appearance. The small bowel is within normal limits. The appendix is not visualized; there is no evidence for appendicitis. Mild diverticulosis is noted along the sigmoid colon, without evidence of diverticulitis. Vascular/Lymphatic: There is marked worsening of the patient's abdominal aortic aneurysm, now measuring approximately 9.2 cm in AP dimension and 10.2 cm in transverse dimension. Vague surrounding soft tissue inflammation is noted, with focal outpouching at the anterior left wall, and additional soft tissue inflammation tracking inferiorly into the pelvis. This is concerning for leakage from the patient's aortic aneurysm.  There is compression of the IVC by the enlarged aneurysm. Prominence of the left common iliac vein is of uncertain significance. Scattered calcification is seen along the abdominal aorta and its branches. No retroperitoneal or pelvic sidewall lymphadenopathy is seen. Reproductive: The bladder is moderately distended and grossly unremarkable. The prostate is normal in size, with scattered calcification. Other: No additional soft tissue abnormalities are seen. Musculoskeletal: No acute osseous abnormalities are identified. The visualized musculature is unremarkable in appearance. IMPRESSION: 1. Marked worsening abdominal aortic aneurysm, now measuring 9.2 cm in AP dimension at 10.2 cm in transverse dimension. Surrounding soft tissue inflammation noted extending inferiorly into the pelvis, concerning for gradual hemorrhage from the aortic aneurysm. Focal outpouching of the anterior left aneurysm wall, of uncertain significance. 2. Small right pleural effusion. Scarring at the lung bases, with underlying bronchiectasis. 3. Relatively severe bilateral renal atrophy. Large left renal cyst. 4. Mild diverticulosis along the sigmoid colon, without evidence of diverticulitis. Critical Value/emergent results were called by telephone at the time of interpretation on 02/27/2018 at 5:18 am to [facility] PA, who verbally acknowledged these results. Electronically Signed   By: Roanna Raider M.D.   On: 02/27/2018 05:25    Procedures Procedures  CRITICAL CARE Performed by: Dione Booze Total critical care time: 90 minutes Critical care time was exclusive of separately billable procedures and treating other patients. Critical care was necessary to treat or prevent imminent or life-threatening deterioration. Critical care was time spent personally by me on the following activities: development of treatment plan with patient and/or surrogate as well as nursing, discussions with consultants, evaluation of patient's  response to treatment, examination of patient, obtaining history from patient or surrogate, ordering and performing treatments and interventions, ordering and review of laboratory studies, ordering and review of radiographic studies, pulse oximetry and re-evaluation of patient's condition.  Medications Ordered in ED Medications  morphine 4 MG/ML injection 4 mg (has no administration in time range)  morphine 4 MG/ML injection 4 mg (4 mg Intravenous Given 02/27/18 0507)  ondansetron (ZOFRAN) injection 4 mg (4 mg Intravenous Given 02/27/18 0505)     Initial Impression / Assessment and Plan /  ED Course  I have reviewed the triage vital signs and the nursing notes.  Pertinent labs & imaging results that were available during my care of the patient were reviewed by me and considered in my medical decision making (see chart for details).  Back pain and patient with known abdominal aortic aneurysm.  Old records are reviewed, and last imaging on record was in 2012 at which time is aneurysm had grown from 4.7 cm to 5.2cm.  Last note from [facility] vascular surgery was 01/26/2014 at which time he was told there is a 10-12% risk of rupture, but also that he wished to avoid surgery because of dialysis risk.  CT scan at [facility] at that time showed aneurysm at 6.4 cm.   CT scan shows aneurysm has enlarged to 10.2 x 9.2 cm with probable area of leaking.  Renal insufficiency has worsened with creatinine 2.67 (was 2.17 on 12/16).  Mild hyponatremia and hypokalemia present.  Bilirubin also mildly elevated.  Hemoglobin has fallen slightly to 11.3 from 11.6 on 12/16, but was 12.6 on 05/12/2016.  [place] is discussed with Dr. Chestine Spore, on-call for vascular surgery.  He requested patient be transferred to the emergency department at [facility].  [place] also discussed with Dr. Wilkie Aye, emergency department physician at [facility], who agrees to accept the patient in transfer.  I have had discussion with the patient.  He is  quite active and functional at home, and is adamant that he does not want to risk going on dialysis.  He will need to discuss with vascular surgery risks versus benefits of IV contrast study, and surgery.  However, given his concern about dialysis, comfort care is also a reasonable option.  Final Clinical Impressions(s) / ED Diagnoses   Final diagnoses:  Abdominal aortic aneurysm (AAA) greater than 5.5 cm in diameter in male [facility])  Acute left-sided low back pain without sciatica  Renal insufficiency  Normochromic normocytic anemia  Hyponatremia  Hypokalemia  Elevated bilirubin    ED Discharge Orders    None       Dione Booze, MD 02/27/18 [phone]

## 2018-02-27 NOTE — ED Notes (Signed)
Patient transported to CT 

## 2018-02-27 NOTE — H&P (Signed)
History and Physical    [name] [name] NWG:[id number] DOB: Mar 28, 1929 DOA: 02/27/2018  PCP: Sharon Seller, NP   Patient coming from: Home  Chief Complaint: Back pain  HPI: [name] [name] is a 84 y.o. male with medical history significant for abdominal aortic aneurysm, hypertension, CKD stage IV, diastolic dysfunction, hyperlipidemia, thyroid disease Acute area who comes to the ER for evaluation of low back pain for some time.  Patient reports that he has had low back pain for some time but got worse in the past 2 weeks, has been taking over-the-counter medication without relief.  He saw his primary care doctor on 12/16.  He rates his pain 10 out of 10 and after iv meds has come down to 5/10. Patient also denies any nausea, vomiting, chest pain, shortness of breath, fever, chills, leg weakness.  ED Course: Patient was seen at [facility] long.  blood pressure stable in the 120-130s systolic.  Hyponatremia, mild hypokalemia, renal failure with creatinine 2.6, mildly elevated bilirubin.  CT scan of the brain was done without contrast that showed Symptomatic AAA of 10 cm as reported below:  "Marked worsening abdominal aortic aneurysm, now measuring 9.2 cm in AP dimension at 10.2 cm in transverse dimension. Surrounding soft tissue inflammation noted extending inferiorly into the pelvis, concerning for gradual hemorrhage from the aortic aneurysm. Focal outpouching of the anterior left aneurysm wall, of uncertain significance. 2. Small right pleural effusion. Scarring at the lung bases, with underlying bronchiectasis. 3. Relatively severe bilateral renal atrophy. Large left renal cyst."  Patient was transferred to [facility] from was a long, stat vascular surgery consult was ordered. Dr. Chestine Spore from vascular saw the patient and it seems patient has known history of AAA and he declined repair 5 years ago when there was risk he could end up on dialysis.  After extensive discussion of vascular surgery,  patient decided not to pursue any intervention.  He opted for comfort measures/hospice/palliative care.  Palliative care was consulted.  Review of Systems: All systems were reviewed and were negative except as mentioned in HPI above.   Past Medical History:  Diagnosis Date  . AAA (abdominal aortic aneurysm) (HCC)   . Benign hypertension with CKD (chronic kidney disease) stage IV (HCC)   . Diastolic dysfunction   . HTN (hypertension)   . Hyperlipidemia   . Hyperparathyroidism (HCC)   . Thyroid disease     Past Surgical History:  Procedure Laterality Date  . CYSTECTOMY  2003   removed from pancrease, per patient at [facility] NP appointment      reports that he quit smoking about 29 years ago. His smoking use included cigarettes. He has a 55.00 pack-year smoking history. He quit smokeless tobacco use about 29 years ago. He reports that he does not drink alcohol or use drugs.  No Known Allergies  Family History  Problem Relation Age of Onset  . Heart failure Mother 9  . Other Father 102       fall  . Heart attack Brother 43  . Other Brother        Lung problems     Prior to Admission medications   Medication Sig Start Date End Date Taking? Authorizing Provider  acetaminophen (TYLENOL) 500 MG tablet Take 500 mg by mouth 2 (two) times daily.   Yes [provider]  amLODipine (NORVASC) 10 MG tablet TAKE 1 TABLET(10 MG) BY MOUTH DAILY Patient taking differently: Take 10 mg by mouth daily.  12/14/17  Yes Pickard,  Priscille Heidelberg, MD  furosemide (LASIX) 40 MG tablet Take 1 tablet (40 mg total) by mouth daily. 02/22/18  Yes Sharon Seller, NP  hydrocortisone cream 1 % Apply 1 application topically as needed for itching.   Yes [provider]  loperamide (IMODIUM) 2 MG capsule Take 2 mg by mouth every other day.   Yes [provider]  moxifloxacin (AVELOX) 400 MG tablet Take 1 tablet (400 mg total) by mouth daily. 02/23/18  Yes Sharon Seller, NP  Multiple  Vitamin (MULTIVITAMIN WITH MINERALS) TABS tablet Take 1 tablet by mouth daily.   Yes [provider]  potassium chloride SA (K-DUR,KLOR-CON) 20 MEQ tablet Take 1 tablet (20 mEq total) by mouth daily. 02/23/18  Yes Sharon Seller, NP  pravastatin (PRAVACHOL) 80 MG tablet TAKE 1 TABLET BY MOUTH DAILY AT 6 PM Patient taking differently: Take 80 mg by mouth daily.  12/14/17  Yes Donita Brooks, MD  sertraline (ZOLOFT) 50 MG tablet TAKE 2 TABLETS(100 MG) BY MOUTH DAILY Patient taking differently: Take 100 mg by mouth daily.  06/14/17  Yes Donita Brooks, MD  sodium bicarbonate 650 MG tablet TAKE 1 TABLET(650 MG) BY MOUTH TWICE DAILY Patient taking differently: Take 650 mg by mouth 2 (two) times daily.  06/14/17  Yes Donita Brooks, MD    Physical Exam: Vitals:   02/27/18 0815 02/27/18 0830 02/27/18 0845 02/27/18 0900  BP: 123/85 (!) 133/94 (!) 131/91 (!) 137/102  Pulse: 76 72 69 (!) 105  Resp: (!) 22 (!) 21 13 (!) 21  Temp:      TempSrc:      SpO2: 93% 92% 93% 92%  Weight:      Height:        Constitutional: NAD, calm, comfortable Vitals:   02/27/18 0815 02/27/18 0830 02/27/18 0845 02/27/18 0900  BP: 123/85 (!) 133/94 (!) 131/91 (!) 137/102  Pulse: 76 72 69 (!) 105  Resp: (!) 22 (!) 21 13 (!) 21  Temp:      TempSrc:      SpO2: 93% 92% 93% 92%  Weight:      Height:        General : Alert awake, chronically sick looking, ELDERLY FRAIL.   Eyes: PERRL, lids and conjunctivae normal ENMT: Mucous membranes are moist. Posterior pharynx clear of any exudate or lesions. Normal dentition.  Neck: Normal, supple, no masses, no thyromegaly Respiratory: Bilaterally clear to auscultation, no wheezing or crackles.Normal respiratory effort.No accessory muscle use.  Cardiovascular: Regular rate and rhythm, no murmurs / rubs / gallops.2+ pedal pulses. No carotid bruits.  Abdomen: Soft, pulsatile abdominal mass-did not palpate deeply due to risk of rupture. bowel sounds  present Musculoskeletal: no clubbing/cyanosis. No joint deformity upper and lower extremities. Good ROM, no contractures. Normal muscle tone.  Skin: No rashes, lesions, ulcers.No induration Neurologic: CN 2-12 grossly intact. Sensation intact, able to move upper and lower extremities with no focal weakness Psychiatric: Normal judgment and insight. Alert and oriented x 3. Normal mood.   Foley Catheter:  Labs on Admission: I have personally reviewed following labs and imaging studies  CBC: Recent Labs  Lab 02/21/18 0958 02/22/18 02/27/18 0425  WBC 11.5* 11.5 17.3*  NEUTROABS 10,362*  --  15.2*  HGB 11.6* 11.6* 11.3*  HCT 34.3* 34* 35.0*  MCV 93.5  --  99.2  PLT 115* 115* 195   Basic Metabolic Panel: Recent Labs  Lab 02/21/18 0958 02/22/18 02/27/18 0425  NA 140 140 131*  K  3.0* 3.0* 3.2*  CL 96*  --  90*  CO2 30  --  25  GLUCOSE 104*  --  142*  BUN 26* 26* 41*  CREATININE 2.17* 2.2* 2.67*  CALCIUM 8.5*  --  8.0*   GFR: Estimated Creatinine Clearance: 15.7 mL/min (A) (by C-G formula based on SCr of 2.67 mg/dL (H)). Liver Function Tests: Recent Labs  Lab 02/21/18 0958 02/22/18 02/27/18 0425  AST 12 12* 48*  ALT 9 9* 33  ALKPHOS  --  111 80  BILITOT 0.7  --  1.5*  PROT 6.0*  --  5.7*  ALBUMIN  --   --  2.6*   Recent Labs  Lab 02/21/18 0958  LIPASE <5*  AMYLASE <10*   No results for input(s): AMMONIA in the last 168 hours. Coagulation Profile: No results for input(s): INR, PROTIME in the last 168 hours. Cardiac Enzymes: No results for input(s): CKTOTAL, CKMB, CKMBINDEX, TROPONINI in the last 168 hours. BNP (last 3 results) No results for input(s): PROBNP in the last 8760 hours. HbA1C: No results for input(s): HGBA1C in the last 72 hours. CBG: No results for input(s): GLUCAP in the last 168 hours. Lipid Profile: No results for input(s): CHOL, HDL, LDLCALC, TRIG, CHOLHDL, LDLDIRECT in the last 72 hours. Thyroid Function Tests: No results for input(s): TSH,  T4TOTAL, FREET4, T3FREE, THYROIDAB in the last 72 hours. Anemia Panel: No results for input(s): VITAMINB12, FOLATE, FERRITIN, TIBC, IRON, RETICCTPCT in the last 72 hours. Urine analysis:    Component Value Date/Time   COLORURINE YELLOW 10/28/2008 0145   APPEARANCEUR CLOUDY (A) 10/28/2008 0145   LABSPEC 1.013 10/28/2008 0145   PHURINE 6.5 10/28/2008 0145   GLUCOSEU 250 (A) 10/28/2008 0145   HGBUR SMALL (A) 10/28/2008 0145   BILIRUBINUR NEGATIVE 10/28/2008 0145   KETONESUR 15 (A) 10/28/2008 0145   PROTEINUR 100 (A) 10/28/2008 0145   UROBILINOGEN 0.2 10/28/2008 0145   NITRITE NEGATIVE 10/28/2008 0145   LEUKOCYTESUR TRACE (A) 10/28/2008 0145   Radiological Exams on Admission: Ct Abdomen Pelvis Wo Contrast  Result Date: 02/27/2018 CLINICAL DATA:  Acute onset of pulsatile abdominal mass. Assess for abdominal aortic aneurysm. EXAM: CT ABDOMEN AND PELVIS WITHOUT CONTRAST TECHNIQUE: Multidetector CT imaging of the abdomen and pelvis was performed following the standard protocol without IV contrast. COMPARISON:  CT of the abdomen and pelvis performed 10/06/2010 FINDINGS: Lower chest: A small right pleural effusion is noted. Underlying bronchiectasis is seen, with scarring at the lung bases bilaterally. There is aneurysmal dilatation of the descending thoracic aorta to 4.4 cm in AP dimension, and to 4.7 cm in transverse dimension more distally. Hepatobiliary: The liver is unremarkable in appearance. The patient is status post cholecystectomy, with clips noted at the gallbladder fossa. The common bile duct remains normal in caliber. Pancreas: There is diffuse atrophy of much of the pancreas. Spleen: The spleen is unremarkable in appearance. Adrenals/Urinary Tract: The adrenal glands are unremarkable in appearance. A large left renal cyst is noted. Relatively severe bilateral renal atrophy is noted, more prominent on the right. There is no evidence of hydronephrosis. No renal or ureteral stones are  identified. Nonspecific perinephric stranding is noted bilaterally. Stomach/Bowel: The stomach is unremarkable in appearance. The small bowel is within normal limits. The appendix is not visualized; there is no evidence for appendicitis. Mild diverticulosis is noted along the sigmoid colon, without evidence of diverticulitis. Vascular/Lymphatic: There is marked worsening of the patient's abdominal aortic aneurysm, now measuring approximately 9.2 cm in AP dimension and 10.2  cm in transverse dimension. Vague surrounding soft tissue inflammation is noted, with focal outpouching at the anterior left wall, and additional soft tissue inflammation tracking inferiorly into the pelvis. This is concerning for leakage from the patient's aortic aneurysm. There is compression of the IVC by the enlarged aneurysm. Prominence of the left common iliac vein is of uncertain significance. Scattered calcification is seen along the abdominal aorta and its branches. No retroperitoneal or pelvic sidewall lymphadenopathy is seen. Reproductive: The bladder is moderately distended and grossly unremarkable. The prostate is normal in size, with scattered calcification. Other: No additional soft tissue abnormalities are seen. Musculoskeletal: No acute osseous abnormalities are identified. The visualized musculature is unremarkable in appearance. IMPRESSION: 1. Marked worsening abdominal aortic aneurysm, now measuring 9.2 cm in AP dimension at 10.2 cm in transverse dimension. Surrounding soft tissue inflammation noted extending inferiorly into the pelvis, concerning for gradual hemorrhage from the aortic aneurysm. Focal outpouching of the anterior left aneurysm wall, of uncertain significance. 2. Small right pleural effusion. Scarring at the lung bases, with underlying bronchiectasis. 3. Relatively severe bilateral renal atrophy. Large left renal cyst. 4. Mild diverticulosis along the sigmoid colon, without evidence of diverticulitis. Critical  Value/emergent results were called by telephone at the time of interpretation on 02/27/2018 at 5:18 am to Rimrock FoundationRobert Browning PA, who verbally acknowledged these results. Electronically Signed   By: Roanna RaiderJeffery  Chang M.D.   On: 02/27/2018 05:25     Assessment/Plan  AAA 10 cm symptomatic, not a candidate for repair: seen by vascular surgery and patient wishses to  pursue comfort measures.Palliative care has been consulted. Will keep him on po and iv opiates for pain control  Hypertension : Controlled, continue amlodipine at 5 mg.    AKI on CKD stage IV: With mild worsening of renal function as below.  Encourage oral hydration.?  IV fluids given his diastolic dysfunction, severe dyspnea with minimal activity. Recent Labs  Lab 02/21/18 0958 02/22/18 02/27/18 0425  BUN 26* 26* 41*  CREATININE 2.17* 2.2* 2.67*   Diastolic dysfunction : Patient has baseline dyspnea with minimal activity. Can use prn lasix for symptom control  Goals of care : We discussed in detail with the presence of family wife and son, patient  would like to pursue comfort measures preferably hospice at home.  I have notified the palliative care.    Severity of Illness: The appropriate patient status for this patient is INPATIENT. Inpatient status is judged to be reasonable and necessary in order to provide the required intensity of service to ensure the patient's safety. The patient's presenting symptoms, physical exam findings, and initial radiographic and laboratory data in the context of their chronic comorbidities is felt to place them at high risk for further clinical deterioration. Furthermore, it is not anticipated that the patient will be medically stable for discharge from the hospital within 2 midnights of admission.  * I certify that at the point of admission it is my clinical judgment that the patient will require inpatient hospital care spanning beyond 2 midnights from the point of admission due to high intensity of  service, high risk for further deterioration and high frequency of surveillance required.*    DVT prophylaxis:  SCD Code Status: DNR/comfort measure Family Communication: Admission, patients condition and plan of care including tests being ordered have been discussed with the patient, his wife and his son who indicate understanding and agree with the plan and Code Status.   Consults called:  Lanae Boastamesh Justyce Yeater MD [facility] Pager [phone]  If 7PM-7AM, please contact night-coverage [email or web address] Password [email or web address]  02/27/2018, 9:15 AM

## 2018-02-27 NOTE — ED Notes (Signed)
ED Provider at bedside. 

## 2018-02-28 ENCOUNTER — Other Ambulatory Visit: Payer: Self-pay

## 2018-02-28 ENCOUNTER — Ambulatory Visit: Payer: Self-pay | Admitting: Nurse Practitioner

## 2018-02-28 ENCOUNTER — Telehealth: Payer: Self-pay | Admitting: *Deleted

## 2018-02-28 LAB — BASIC METABOLIC PANEL
Anion gap: 13 (ref 5–15)
BUN: 46 mg/dL — ABNORMAL HIGH (ref 8–23)
CO2: 27 mmol/L (ref 22–32)
CREATININE: 3.2 mg/dL — AB (ref 0.61–1.24)
Calcium: 7.4 mg/dL — ABNORMAL LOW (ref 8.9–10.3)
Chloride: 91 mmol/L — ABNORMAL LOW (ref 98–111)
GFR calc Af Amer: 19 mL/min — ABNORMAL LOW (ref >60)
GFR calc non Af Amer: 17 mL/min — ABNORMAL LOW (ref >60)
Glucose, Bld: 98 mg/dL (ref 70–99)
Potassium: 4.3 mmol/L (ref 3.5–5.1)
Sodium: 131 mmol/L — ABNORMAL LOW (ref 135–145)

## 2018-02-28 MED ORDER — OXYCODONE HCL 5 MG PO TABS: 5.0000 mg | ORAL_TABLET | ORAL | 0 refills | Status: AC | PRN

## 2018-02-28 NOTE — Progress Notes (Signed)
[facility]) Hospital Liaison:  SW Note Notified by [facility] of patient/family request for [facility] services at home after discharge. Chart and patient information reviewed by [facility] physician and eligibility has been confirmed.  Met with patient at bedside to initiate education related to hospice philosophy, services and team approach to care. Spoke with his wife [name] Guiles and his son Liliane Channel by phone to confirm plan for transfer home today. Patient/family verbalized understanding of information given.  Per discussion, plan is for discharge to home today by [place].  Please send signed and completed DNR form home with patient/family.    Patient will need prescriptions for discharge comfort medications.  DME needs have been discussed, patient currently has a walker at home. Family in agreement with ordering oxygen setup for home. Portable tank has been ordered for delivery to room for transport home. Family would like to wait until patient arrives home to determine other DME needs. Son Audry Pili is the contact for [place] to arrange oxygen delivery. Audry Pili is in the home waiting for the call. Ricky's phone number is [phone].  [facility] aware of the above.  Completed discharge summary will need to be faxed to [facility] at [phone] when final.  Please notify [facility] when patient is ready to leave the unit at discharge. (Call [phone] between 8:30 and 5pm. Call [phone] after 5pm.)  [facility] information and contact numbers given to son Audry Pili during phone call. Brochure left in room. Above information shared with [facility]. Please call with hospice related questions. Thank you for this referral.  Erling Conte,  [facility] Liaison [place] are listed on AMION.

## 2018-02-28 NOTE — Discharge Summary (Signed)
Physician Discharge Summary  [name] [name] WUJ:[id number] DOB: 09/22/1929 DOA: 02/27/2018  PCP: Sharon Seller, NP  Admit date: 02/27/2018 Discharge date: 02/28/2018  Admitted From: home Disposition:  home w hospice  Recommendations for Outpatient Follow-up:  1. Follow up with hospice  Home Health: hospice Equipment/Devices: oxygen 3 L, pain meds  Discharge Condition: stable CODE STATUS: DNR Diet recommendation: Heart Healthy / Carb Modified / Regular / Dysphagia   Brief/Interim Summary: 84 y.o. male with medical history significant for abdominal aortic aneurysm, hypertension, CKD stage IV, diastolic dysfunction, hyperlipidemia, thyroid disease Acute area who comes to the ER for evaluation of low back pain for some time.  Patient reports that he has had low back pain for some time but got worse in the past 2 weeks, has been taking over-the-counter medication without relief.  He saw his primary care doctor on 12/16.  He rates his pain 10 out of 10 and after iv meds has come down to 5/10. Patient also denies any nausea, vomiting, chest pain, shortness of breath, fever, chills, leg weakness.  ED Course: Patient was seen at [facility] long.  blood pressure stable in the 120-130s systolic.  Hyponatremia, mild hypokalemia, renal failure with creatinine 2.6, mildly elevated bilirubin.  CT scan of the brain was done without contrast that showed Symptomatic AAA of 10 cm as reported below: "Marked worsening abdominal aortic aneurysm, now measuring 9.2 cm in AP dimension at 10.2 cm in transverse dimension. Surrounding soft tissue inflammation noted extending inferiorly into the pelvis, concerning for gradual hemorrhage from the aortic aneurysm. Focal outpouching of the anterior left aneurysm wall, of uncertain significance. 2. Small right pleural effusion. Scarring at the lung bases, with underlying bronchiectasis. 3. Relatively severe bilateral renal atrophy. Large left renal cyst."  Patient was  transferred to [facility] from was a long, stat vascular surgery consult was ordered.Dr. Chestine Spore from vascular saw the patient and it seems patient has known history of AAA and he declined repair 5 years ago when there was risk he could end up on dialysis.  After extensive discussion of vascular surgery, patient decided not to pursue any intervention.  He opted for comfort measures/hospice/palliative care.  Palliative care was consulted. Patient was seen by periductular and referred to hospice and accepted.  He is being discharged to hospice at his home with pain control/narcotics. Rest of issues are chronic and stable as below. AAA 10 cm symptomatic, not a candidate for repair: Hypertension : Controlled, Cont home medication. AKI on CKD stage IV: With mild worsening of renal function as below.  Encourage oral hydration. Diastolic dysfunction : Patient has baseline dyspnea with minimal activity. Can use prn lasix for symptom control  Discharge Instructions   Allergies as of 02/28/2018   No Known Allergies     Medication List    TAKE these medications   acetaminophen 500 MG tablet Commonly known as:  TYLENOL Take 500 mg by mouth 2 (two) times daily.   amLODipine 10 MG tablet Commonly known as:  NORVASC TAKE 1 TABLET(10 MG) BY MOUTH DAILY What changed:  See the new instructions.   furosemide 40 MG tablet Commonly known as:  LASIX Take 1 tablet (40 mg total) by mouth daily.   hydrocortisone cream 1 % Apply 1 application topically as needed for itching.   loperamide 2 MG capsule Commonly known as:  IMODIUM Take 2 mg by mouth every other day.   moxifloxacin 400 MG tablet Commonly known as:  AVELOX Take 1 tablet (400 mg  total) by mouth daily.   multivitamin with minerals Tabs tablet Take 1 tablet by mouth daily.   oxyCODONE 5 MG immediate release tablet Commonly known as:  Oxy IR/ROXICODONE Take 1 tablet (5 mg total) by mouth every 4 (four) hours as needed for up to 12 doses  for severe pain.   potassium chloride SA 20 MEQ tablet Commonly known as:  K-DUR,KLOR-CON Take 1 tablet (20 mEq total) by mouth daily.   pravastatin 80 MG tablet Commonly known as:  PRAVACHOL TAKE 1 TABLET BY MOUTH DAILY AT 6 PM What changed:  See the new instructions.   sertraline 50 MG tablet Commonly known as:  ZOLOFT TAKE 2 TABLETS(100 MG) BY MOUTH DAILY What changed:  See the new instructions.   sodium bicarbonate 650 MG tablet TAKE 1 TABLET(650 MG) BY MOUTH TWICE DAILY What changed:  See the new instructions.       No Known Allergies  Consultations:   Procedures/Studies: Ct Abdomen Pelvis Wo Contrast  Result Date: 02/27/2018 CLINICAL DATA:  Acute onset of pulsatile abdominal mass. Assess for abdominal aortic aneurysm. EXAM: CT ABDOMEN AND PELVIS WITHOUT CONTRAST TECHNIQUE: Multidetector CT imaging of the abdomen and pelvis was performed following the standard protocol without IV contrast. COMPARISON:  CT of the abdomen and pelvis performed 10/06/2010 FINDINGS: Lower chest: A small right pleural effusion is noted. Underlying bronchiectasis is seen, with scarring at the lung bases bilaterally. There is aneurysmal dilatation of the descending thoracic aorta to 4.4 cm in AP dimension, and to 4.7 cm in transverse dimension more distally. Hepatobiliary: The liver is unremarkable in appearance. The patient is status post cholecystectomy, with clips noted at the gallbladder fossa. The common bile duct remains normal in caliber. Pancreas: There is diffuse atrophy of much of the pancreas. Spleen: The spleen is unremarkable in appearance. Adrenals/Urinary Tract: The adrenal glands are unremarkable in appearance. A large left renal cyst is noted. Relatively severe bilateral renal atrophy is noted, more prominent on the right. There is no evidence of hydronephrosis. No renal or ureteral stones are identified. Nonspecific perinephric stranding is noted bilaterally. Stomach/Bowel: The stomach  is unremarkable in appearance. The small bowel is within normal limits. The appendix is not visualized; there is no evidence for appendicitis. Mild diverticulosis is noted along the sigmoid colon, without evidence of diverticulitis. Vascular/Lymphatic: There is marked worsening of the patient's abdominal aortic aneurysm, now measuring approximately 9.2 cm in AP dimension and 10.2 cm in transverse dimension. Vague surrounding soft tissue inflammation is noted, with focal outpouching at the anterior left wall, and additional soft tissue inflammation tracking inferiorly into the pelvis. This is concerning for leakage from the patient's aortic aneurysm. There is compression of the IVC by the enlarged aneurysm. Prominence of the left common iliac vein is of uncertain significance. Scattered calcification is seen along the abdominal aorta and its branches. No retroperitoneal or pelvic sidewall lymphadenopathy is seen. Reproductive: The bladder is moderately distended and grossly unremarkable. The prostate is normal in size, with scattered calcification. Other: No additional soft tissue abnormalities are seen. Musculoskeletal: No acute osseous abnormalities are identified. The visualized musculature is unremarkable in appearance. IMPRESSION: 1. Marked worsening abdominal aortic aneurysm, now measuring 9.2 cm in AP dimension at 10.2 cm in transverse dimension. Surrounding soft tissue inflammation noted extending inferiorly into the pelvis, concerning for gradual hemorrhage from the aortic aneurysm. Focal outpouching of the anterior left aneurysm wall, of uncertain significance. 2. Small right pleural effusion. Scarring at the lung bases, with underlying bronchiectasis. 3.  Relatively severe bilateral renal atrophy. Large left renal cyst. 4. Mild diverticulosis along the sigmoid colon, without evidence of diverticulitis. Critical Value/emergent results were called by telephone at the time of interpretation on 02/27/2018 at  5:18 am to [facility] PA, who verbally acknowledged these results. Electronically Signed   By: Roanna Raider M.D.   On: 02/27/2018 05:25   Dg Chest 2 View  Result Date: 02/22/2018 CLINICAL DATA:  Productive cough. EXAM: CHEST - 2 VIEW COMPARISON:  05/06/2016.  03/22/2013.  CT 10/06/2010 FINDINGS: Mediastinum hilar structures normal. Cardiomegaly with normal pulmonary vascularity. Multifocal right lung pulmonary infiltrates and moderate size right pleural effusion. Left lung base pleural-parenchymal thickening consistent with scarring again noted calcified pleural plaque on the left again noted. This suggest prior asbestos exposure. Biapical pleural thickening consistent scarring. Cardiomegaly with normal pulmonary vascularity. IMPRESSION: 1. Multifocal right lung pulmonary infiltrates. Moderate right pleural effusion. 2. Left base pleural-parenchymal scarring with calcified pleural plaque. Calcified pleural plaques suggest prior asbestos exposure. Biapical pleural thickening consistent scarring. 3.  Cardiomegaly.  No pulmonary venous congestion. Electronically Signed   By: Maisie Fus  Register   On: 02/22/2018 12:45     Subjective:   Discharge Exam: Vitals:   02/27/18 2120 02/28/18 0520  BP: 118/83 105/85  Pulse: 72 85  Resp: 18 18  Temp: 98.6 F (37 C) 98.4 F (36.9 C)  SpO2: 92% 95%   Vitals:   02/27/18 1021 02/27/18 1354 02/27/18 2120 02/28/18 0520  BP: 120/87 107/86 118/83 105/85  Pulse: 80 75 72 85  Resp: 16 20 18 18   Temp: (!) 96.3 F (35.7 C) (!) 97.5 F (36.4 C) 98.6 F (37 C) 98.4 F (36.9 C)  TempSrc: Axillary Oral Oral Oral  SpO2: 91% (!) 89% 92% 95%  Weight:      Height:        General: Pt is alert, awake, elderly,NAD Cardiovascular: RRR, S1/S2 +, no rubs, no gallops Respiratory: CTA bilaterally, no wheezing, no rhonchi Abdominal: Soft,  PULSATILE MASS, NT, ND, bowel sounds + Extremities: no edema, no cyanosis   The results of significant diagnostics from  this hospitalization (including imaging, microbiology, ancillary and laboratory) are listed below for reference.     Microbiology: Recent Results (from the past 240 hour(s))  Stool culture     Status: None (Preliminary result)   Collection Time: 02/24/18  8:00 AM  Result Value Ref Range Status   MICRO NUMBER: [id number]  Final   SPECIMEN QUALITY: Adequate  Final   SOURCE: STOOL  Final   STATUS: FINAL  Final   SHIGA RESULT: Not Detected  Final   MICRO NUMBER: [id number]  Preliminary   SPECIMEN QUALITY: Adequate  Preliminary   Source STOOL  Preliminary   STATUS: PRELIMINARY  Preliminary   CAM RESULT: Culture in progress  Preliminary   MICRO NUMBER: [id number]  Final   SPECIMEN QUALITY: Adequate  Final   SOURCE: STOOL  Final   STATUS: FINAL  Final   SS RESULT:   Final    Normal enteric flora not present. No Salmonella or Shigella isolated     Labs: BNP (last 3 results) Recent Labs    02/21/18 0958  BNP 437*   Basic Metabolic Panel: Recent Labs  Lab 02/22/18 02/27/18 0425 02/28/18 0828  NA 140 131* 131*  K 3.0* 3.2* 4.3  CL  --  90* 91*  CO2  --  25 27  GLUCOSE  --  142* 98  BUN 26* 41* 46*  CREATININE 2.2* 2.67*  3.20*  CALCIUM  --  8.0* 7.4*   Liver Function Tests: Recent Labs  Lab 02/22/18 02/27/18 0425  AST 12* 48*  ALT 9* 33  ALKPHOS 111 80  BILITOT  --  1.5*  PROT  --  5.7*  ALBUMIN  --  2.6*   No results for input(s): LIPASE, AMYLASE in the last 168 hours. No results for input(s): AMMONIA in the last 168 hours. CBC: Recent Labs  Lab 02/22/18 02/27/18 0425  WBC 11.5 17.3*  NEUTROABS  --  15.2*  HGB 11.6* 11.3*  HCT 34* 35.0*  MCV  --  99.2  PLT 115* 195   Cardiac Enzymes: No results for input(s): CKTOTAL, CKMB, CKMBINDEX, TROPONINI in the last 168 hours. BNP: Invalid input(s): POCBNP CBG: No results for input(s): GLUCAP in the last 168 hours. D-Dimer No results for input(s): DDIMER in the last 72 hours. Hgb A1c No results for input(s): HGBA1C  in the last 72 hours. Lipid Profile No results for input(s): CHOL, HDL, LDLCALC, TRIG, CHOLHDL, LDLDIRECT in the last 72 hours. Thyroid function studies No results for input(s): TSH, T4TOTAL, T3FREE, THYROIDAB in the last 72 hours.  Invalid input(s): FREET3 Anemia work up No results for input(s): VITAMINB12, FOLATE, FERRITIN, TIBC, IRON, RETICCTPCT in the last 72 hours. Urinalysis    Component Value Date/Time   COLORURINE YELLOW 02/27/2018 2142   APPEARANCEUR CLEAR 02/27/2018 2142   LABSPEC 1.010 02/27/2018 2142   PHURINE 5.5 02/27/2018 2142   GLUCOSEU NEGATIVE 02/27/2018 2142   HGBUR SMALL (A) 02/27/2018 2142   BILIRUBINUR NEGATIVE 02/27/2018 2142   KETONESUR NEGATIVE 02/27/2018 2142   PROTEINUR NEGATIVE 02/27/2018 2142   UROBILINOGEN 0.2 10/28/2008 0145   NITRITE NEGATIVE 02/27/2018 2142   LEUKOCYTESUR SMALL (A) 02/27/2018 2142   Sepsis Labs Invalid input(s): PROCALCITONIN,  WBC,  LACTICIDVEN Microbiology Recent Results (from the past 240 hour(s))  Stool culture     Status: None (Preliminary result)   Collection Time: 02/24/18  8:00 AM  Result Value Ref Range Status   MICRO NUMBER: [id number]  Final   SPECIMEN QUALITY: Adequate  Final   SOURCE: STOOL  Final   STATUS: FINAL  Final   SHIGA RESULT: Not Detected  Final   MICRO NUMBER: [id number]  Preliminary   SPECIMEN QUALITY: Adequate  Preliminary   Source STOOL  Preliminary   STATUS: PRELIMINARY  Preliminary   CAM RESULT: Culture in progress  Preliminary   MICRO NUMBER: [id number]  Final   SPECIMEN QUALITY: Adequate  Final   SOURCE: STOOL  Final   STATUS: FINAL  Final   SS RESULT:   Final    Normal enteric flora not present. No Salmonella or Shigella isolated     Time coordinating discharge: 35 minutes  SIGNED:   Lanae Boastamesh Keldon Lassen, MD  [facility] 02/28/2018, 10:18 AM Pager [phone]  If 7PM-7AM, please contact night-coverage [email or web address] Password [email or web address]  Please Note: This patient record was dictated using  Animal nutritionistDragon software. Chart creation errors have been sought, but may not always have been located. Such creation errors do not reflect on the Standard of Medical Care.

## 2018-02-28 NOTE — Telephone Encounter (Signed)
Yes this will be fine

## 2018-02-28 NOTE — Telephone Encounter (Signed)
[name] [name] with Hospice called and wanted to know if you will be patient's Attending of Record. Patient is being discharged from Hospital. Please Advise.

## 2018-02-28 NOTE — Telephone Encounter (Signed)
Jenn Notified and agreed.

## 2018-02-28 NOTE — Care Management Note (Signed)
[name] Management Note  Patient Details  Name: [name] [name] MRN: [id number] Date of Birth: 24-Aug-1929  Subjective/Objective:                    Action/Plan:  Patient wanting to go home with [facility]ospice and [facility] [place].   Carley Hammed with Hospice and Palliative [facility] working on referral. Patient currently on oxygen , does not have home oxygen. Carley Hammed has ordered home oxygen through [facility]. Patient plans to go home via private care ( son [name] [name]). [facility] will bring portable tank to patient's hospital room before discharge, however, home oxygen concentrator will have to be delivered to home. Carley Hammed has tried to call home phone number however no answer. Patient states his wife is very hard of hearing. His son [name] [name] is going to his father's home to pick up clothes and then coming to hospital . Patient does not know [name] [name]'s phone number. Once [name] [name] arrives will arrange for home oxygen delivery. Expected Discharge Date:                  Expected Discharge Plan:  Home w Hospice Care  In-House Referral:  Hospice / Palliative Care  Discharge planning Services  CM Consult  Post Acute Care Choice:  Home Health, Durable Medical Equipment Choice offered to:  Patient  DME Arranged:  Oxygen DME Agency:  [facility]  HH Arranged:    [facility] Agency:  [facility] [place]  Status of Service:  In process, will continue to follow  If discussed at Long Length of Stay Meetings, dates discussed:    Additional Comments:  [name], RN 02/28/2018, 10:04 AM

## 2018-03-01 ENCOUNTER — Telehealth: Payer: Self-pay

## 2018-03-01 NOTE — Telephone Encounter (Signed)
Boneta Lucks with Hospice and Pallitive Care called to inform provider  1.) Patient's B/P is 70/50 2.) Patient without urination x 12 hours 3.) Patient was given a short term supply of oxycodone and needs additional pills sent to pharmacy   Boneta Lucks was informed we do not have a provider on site and I will send message to covering provider and her medical assistant to follow-up for the office is closed.  Message forwarded to [facility], NP and Salem Caster, RMA

## 2018-03-01 NOTE — Telephone Encounter (Signed)
The issue was resolved when I called to [place] from Hospice at [phone]

## 2018-03-03 NOTE — Telephone Encounter (Signed)
I understand that this issue was resolved. Please document what was said for future reference and continuity of care  Thanks

## 2018-03-04 LAB — OVA AND PARASITE EXAMINATION
CONCENTRATE RESULT:: NONE SEEN
MICRO NUMBER:: 91522115
SPECIMEN QUALITY:: ADEQUATE
TRICHROME RESULT:: NONE SEEN

## 2018-03-04 LAB — STOOL CULTURE
MICRO NUMBER:: 91523823
MICRO NUMBER:: 91523824
MICRO NUMBER:: 91523825
SHIGA RESULT:: NOT DETECTED
SPECIMEN QUALITY:: ADEQUATE
SPECIMEN QUALITY:: ADEQUATE
SPECIMEN QUALITY:: ADEQUATE

## 2018-03-16 NOTE — Telephone Encounter (Signed)
No documentation noted as requested. Encounter removed from in-basket due to no response and patient expired

## 2018-03-29 ENCOUNTER — Ambulatory Visit: Payer: PPO | Admitting: Nurse Practitioner

## 2018-04-09 DEATH — deceased
